# Patient Record
Sex: Male | Born: 1950
Health system: Southern US, Community
[De-identification: ages and names within clinical notes are randomized; demographics above are authoritative.]

## PROBLEM LIST (undated history)

## (undated) DIAGNOSIS — E785 Hyperlipidemia, unspecified: Secondary | ICD-10-CM

## (undated) DIAGNOSIS — M51369 Other intervertebral disc degeneration, lumbar region without mention of lumbar back pain or lower extremity pain: Secondary | ICD-10-CM

## (undated) DIAGNOSIS — K219 Gastro-esophageal reflux disease without esophagitis: Secondary | ICD-10-CM

## (undated) DIAGNOSIS — M5136 Other intervertebral disc degeneration, lumbar region: Secondary | ICD-10-CM

## (undated) DIAGNOSIS — I10 Essential (primary) hypertension: Secondary | ICD-10-CM

## (undated) HISTORY — DX: Gastro-esophageal reflux disease without esophagitis: K21.9

## (undated) HISTORY — DX: Essential (primary) hypertension: I10

## (undated) HISTORY — DX: Other intervertebral disc degeneration, lumbar region without mention of lumbar back pain or lower extremity pain: M51.369

## (undated) HISTORY — DX: Other intervertebral disc degeneration, lumbar region: M51.36

## (undated) HISTORY — PX: KNEE ARTHROSCOPY: SUR90

## (undated) HISTORY — DX: Hyperlipidemia, unspecified: E78.5

## (undated) HISTORY — PX: COLONOSCOPY: SHX174

---

## 2008-11-01 HISTORY — PX: POLYPECTOMY: SHX149

## 2019-12-12 ENCOUNTER — Other Ambulatory Visit: Payer: Self-pay

## 2019-12-13 ENCOUNTER — Encounter: Payer: Self-pay | Admitting: Internal Medicine

## 2019-12-13 ENCOUNTER — Ambulatory Visit: Payer: Medicare Other | Admitting: Internal Medicine

## 2019-12-13 VITALS — BP 130/80 | HR 67 | Temp 97.4°F | Ht 69.0 in | Wt 174.4 lb

## 2019-12-13 DIAGNOSIS — E785 Hyperlipidemia, unspecified: Secondary | ICD-10-CM | POA: Insufficient documentation

## 2019-12-13 DIAGNOSIS — L219 Seborrheic dermatitis, unspecified: Secondary | ICD-10-CM

## 2019-12-13 DIAGNOSIS — M5136 Other intervertebral disc degeneration, lumbar region: Secondary | ICD-10-CM | POA: Insufficient documentation

## 2019-12-13 DIAGNOSIS — I10 Essential (primary) hypertension: Secondary | ICD-10-CM | POA: Insufficient documentation

## 2019-12-13 DIAGNOSIS — H6122 Impacted cerumen, left ear: Secondary | ICD-10-CM

## 2019-12-13 DIAGNOSIS — Z1211 Encounter for screening for malignant neoplasm of colon: Secondary | ICD-10-CM

## 2019-12-13 DIAGNOSIS — M51369 Other intervertebral disc degeneration, lumbar region without mention of lumbar back pain or lower extremity pain: Secondary | ICD-10-CM | POA: Insufficient documentation

## 2019-12-13 MED ORDER — LISINOPRIL-HYDROCHLOROTHIAZIDE 20-12.5 MG PO TABS: 1.0000 | ORAL_TABLET | Freq: Two times a day (BID) | ORAL | 1 refills | Status: DC

## 2019-12-13 MED ORDER — TRIAMCINOLONE ACETONIDE 0.1 % EX CREA: 1.0000 "application " | TOPICAL_CREAM | Freq: Two times a day (BID) | CUTANEOUS | 1 refills | Status: DC

## 2019-12-13 MED ORDER — PRAVASTATIN SODIUM 40 MG PO TABS: 40.0000 mg | ORAL_TABLET | Freq: Every day | ORAL | 1 refills | Status: DC

## 2019-12-13 NOTE — Patient Instructions (Signed)
-  Nice seeing you today!!  -Schedule follow up at your convenience for your physical. Please come in fasting that day.  -Triamcinolone cream twice daily to above your left ear.

## 2019-12-13 NOTE — Progress Notes (Signed)
New Patient Office Visit     This visit occurred during the SARS-CoV-2 public health emergency.  Safety protocols were in place, including screening questions prior to the visit, additional usage of staff PPE, and extensive cleaning of exam room while observing appropriate contact time as indicated for disinfecting solutions.    CC/Reason for Visit: Establish care, discuss chronic and acute conditions Previous PCP: Dr. Sherryle Lis in [place] Visit: March 2020  HPI: Dustin Hodges is a 69 y.o. male who is coming in today for the above mentioned reasons. Past Medical History is significant for: Hypertension and hyperlipidemia that have been well controlled as well as lumbar degenerative disc disease that is not causing much issues at present.  He and his wife have recently moved from [place], he is a retired Forensic psychologist, he does not smoke, drinks alcohol occasionally, has no known drug allergies, his past surgical history is only significant for a medial meniscus repair in his knee, his dad is deceased from congestive heart failure in his mother from a CVA.  He complains of discharge out of his left ear and a "crusty scalp" since moving to [place] most prominent above his left ear.   Past Medical/Surgical History: Past Medical History:  Diagnosis Date  . DDD (degenerative disc disease), lumbar   . Hyperlipidemia   . Hypertension     History reviewed. No pertinent surgical history.  Social History:  reports that he has never smoked. He has never used smokeless tobacco. He reports current alcohol use. He reports that he does not use drugs.  Allergies: No Known Allergies  Family History:  Family History  Problem Relation Age of Onset  . CVA Mother   . Heart failure Father      Current Outpatient Medications:  .  aspirin EC 81 MG tablet, Take 81 mg by mouth daily., Disp: , Rfl:  .  Cholecalciferol (VITAMIN D) 50 MCG (2000 UT) tablet, Take  2,000 Units by mouth daily., Disp: , Rfl:  .  fluticasone (FLONASE) 50 MCG/ACT nasal spray, Place 1 spray into both nostrils daily., Disp: , Rfl:  .  lisinopril-hydrochlorothiazide (ZESTORETIC) 20-12.5 MG tablet, Take 1 tablet by mouth 2 (two) times daily., Disp: 90 tablet, Rfl: 1 .  pravastatin (PRAVACHOL) 40 MG tablet, Take 1 tablet (40 mg total) by mouth at bedtime., Disp: 90 tablet, Rfl: 1 .  triamcinolone cream (KENALOG) 0.1 %, Apply 1 application topically 2 (two) times daily., Disp: 30 g, Rfl: 1  Review of Systems:  Constitutional: Denies fever, chills, diaphoresis, appetite change and fatigue.  HEENT: Denies photophobia, eye pain, redness, hearing loss, ear pain, congestion, sore throat, rhinorrhea, sneezing, mouth sores, trouble swallowing, neck pain, neck stiffness and tinnitus.   Respiratory: Denies SOB, DOE, cough, chest tightness,  and wheezing.   Cardiovascular: Denies chest pain, palpitations and leg swelling.  Gastrointestinal: Denies nausea, vomiting, abdominal pain, diarrhea, constipation, blood in stool and abdominal distention.  Genitourinary: Denies dysuria, urgency, frequency, hematuria, flank pain and difficulty urinating.  Endocrine: Denies: hot or cold intolerance, sweats, changes in hair or nails, polyuria, polydipsia. Musculoskeletal: Denies myalgias, back pain, joint swelling, arthralgias and gait problem.  Skin: Denies pallor, rash and wound.  Neurological: Denies dizziness, seizures, syncope, weakness, light-headedness, numbness and headaches.  Hematological: Denies adenopathy. Easy bruising, personal or family bleeding history  Psychiatric/Behavioral: Denies suicidal ideation, mood changes, confusion, nervousness, sleep disturbance and agitation    Physical Exam: Vitals:   12/13/19 0825  BP: 130/80  Pulse:  67  Temp: (!) 97.4 F (36.3 C)  TempSrc: Temporal  SpO2: 97%  Weight: 174 lb 6.4 oz (79.1 kg)  Height: 5\' 9"  (1.753 m)   Body mass index is 25.75  kg/m.  Constitutional: NAD, calm, comfortable Eyes: PERRL, lids and conjunctivae normal, wears corrective lenses ENMT: Mucous membranes are moist. Tympanic membrane is pearly white, no erythema or bulging on the right, left is obstructed by cerumen Neck: normal, supple, no masses, no thyromegaly Respiratory: clear to auscultation bilaterally, no wheezing, no crackles. Normal respiratory effort. No accessory muscle use.  Cardiovascular: Regular rate and rhythm, no murmurs / rubs / gallops. No extremity edema.  Skin: Redness and white, scaly skin over the left ear Neurologic: Grossly intact and nonfocal Psychiatric: Normal judgment and insight. Alert and oriented x 3. Normal mood.    Impression and Plan:  Essential hypertension  - Plan: lisinopril-hydrochlorothiazide (ZESTORETIC) 20-12.5 MG tablet -Well-controlled.  Hyperlipidemia, unspecified hyperlipidemia type  - Plan: pravastatin (PRAVACHOL) 40 MG tablet -Check lipids when he returns for physical  DDD (degenerative disc disease), lumbar -Noted, observation for now  Left ear impacted cerumen -Cerumen Desimpaction  After consent was obtained, warm water was applied and gentle ear lavage performed on Left ear. There were no complications and following the desimpaction the tympanic membranes were visible. Tympanic membranes are intact following the procedure. Auditory canals are normal. The patient reported relief of symptoms after removal of cerumen.   Seborrheic dermatitis  - Plan: triamcinolone cream (KENALOG) 0.1 %  He will return in 8 to 12 weeks for his physical.    Patient Instructions  -Nice seeing you today!!  -Schedule follow up at your convenience for your physical. Please come in fasting that day.  -Triamcinolone cream twice daily to above your left ear.     Lelon Frohlich, MD [facility]

## 2020-01-15 ENCOUNTER — Encounter: Payer: Self-pay | Admitting: Gastroenterology

## 2020-01-30 ENCOUNTER — Other Ambulatory Visit: Payer: Self-pay

## 2020-01-30 ENCOUNTER — Ambulatory Visit (AMBULATORY_SURGERY_CENTER): Payer: Self-pay | Admitting: *Deleted

## 2020-01-30 VITALS — Temp 96.9°F | Ht 69.0 in | Wt 174.0 lb

## 2020-01-30 DIAGNOSIS — Z86010 Personal history of colonic polyps: Secondary | ICD-10-CM

## 2020-01-30 MED ORDER — PEG 3350-KCL-NA BICARB-NACL 420 G PO SOLR: 4000.0000 mL | Freq: Once | ORAL | 0 refills | Status: AC

## 2020-01-30 NOTE — Progress Notes (Signed)
No egg or soy allergy known to patient  No issues with past sedation with any surgeries  or procedures, no intubation problems  No diet pills per patient No home 02 use per patient  No blood thinners per patient  Pt denies issues with constipation  No A fib or A flutter  EMMI video sent to pt's e mail   Pt states 2010 colon in [place] had a benign polyp- he is unsure of path of this polyp- 2015-2016 repeat colon normal-  Pt signed records release and will attempt to find out Md/Practice for [place] to get old colon reports and path - he will call with name if he can find out   3-12 covid vaccine completed  Due to the COVID-19 pandemic we are asking patients to follow these guidelines. Please only bring one care partner. Please be aware that your care partner may wait in the car in the parking lot or if they feel like they will be too hot to wait in the car, they may wait in the lobby on the 4th floor. All care partners are required to wear a mask the entire time (we do not have any that we can provide them), they need to practice social distancing, and we will do a Covid check for all patient's and care partners when you arrive. Also we will check their temperature and your temperature. If the care partner waits in their car they need to stay in the parking lot the entire time and we will call them on their cell phone when the patient is ready for discharge so they can bring the car to the front of the building. Also all patient's will need to wear a mask into building.

## 2020-02-04 ENCOUNTER — Telehealth: Payer: Self-pay | Admitting: Gastroenterology

## 2020-02-04 NOTE — Telephone Encounter (Signed)
Pt called to give you the information that you asked him about. Pt said that he had Colon in 2010 and 2015 or 2016 with Dr. Johnanna Schneiders. His office number is [phone]. Pt already signed release form.

## 2020-02-04 NOTE — Telephone Encounter (Signed)
Records release completed and taken to [facility] todd CMA Dr Loletha Carrow - for Dr Johnanna Schneiders   [facility] end[facility] center - [phone] [place] [id number]  Last colon reports 2010 and 2015 or 2016 - per pt he had polyps-  Fax [phone] marie PV

## 2020-02-08 ENCOUNTER — Encounter: Payer: Self-pay | Admitting: Gastroenterology

## 2020-02-13 ENCOUNTER — Other Ambulatory Visit: Payer: Self-pay

## 2020-02-13 ENCOUNTER — Encounter: Payer: Self-pay | Admitting: Gastroenterology

## 2020-02-13 ENCOUNTER — Ambulatory Visit (AMBULATORY_SURGERY_CENTER): Payer: Medicare Other | Admitting: Gastroenterology

## 2020-02-13 VITALS — BP 92/65 | HR 61 | Temp 97.1°F | Resp 15 | Ht 69.0 in | Wt 174.0 lb

## 2020-02-13 DIAGNOSIS — Z86010 Personal history of colonic polyps: Secondary | ICD-10-CM

## 2020-02-13 DIAGNOSIS — D123 Benign neoplasm of transverse colon: Secondary | ICD-10-CM | POA: Diagnosis not present

## 2020-02-13 MED ORDER — SODIUM CHLORIDE 0.9 % IV SOLN: 500.0000 mL | Freq: Once | INTRAVENOUS | Status: DC

## 2020-02-13 NOTE — Progress Notes (Signed)
Temp by JB Vitals by DT 

## 2020-02-13 NOTE — Progress Notes (Signed)
pt tolerated well. VSS. awake and to recovery. Report given to RN.  

## 2020-02-13 NOTE — Patient Instructions (Signed)
Thank you for allowing [place] to care for you today!  Await results of singular polyp removed, approximately 2 weeks.  Will make recommendation at that time for your next colonoscopy.  Resume previous diet and medications today.  Return to your normal activities tomorrow.  YOU HAD AN ENDOSCOPIC PROCEDURE TODAY AT [facility]:   Refer to the procedure report that was given to you for any specific questions about what was found during the examination.  If the procedure report does not answer your questions, please call your gastroenterologist to clarify.  If you requested that your care partner not be given the details of your procedure findings, then the procedure report has been included in a sealed envelope for you to review at your convenience later.  YOU SHOULD EXPECT: Some feelings of bloating in the abdomen. Passage of more gas than usual.  Walking can help get rid of the air that was put into your GI tract during the procedure and reduce the bloating. If you had a lower endoscopy (such as a colonoscopy or flexible sigmoidoscopy) you may notice spotting of blood in your stool or on the toilet paper. If you underwent a bowel prep for your procedure, you may not have a normal bowel movement for a few days.  Please Note:  You might notice some irritation and congestion in your nose or some drainage.  This is from the oxygen used during your procedure.  There is no need for concern and it should clear up in a day or so.  SYMPTOMS TO REPORT IMMEDIATELY:   Following lower endoscopy (colonoscopy or flexible sigmoidoscopy):  Excessive amounts of blood in the stool  Significant tenderness or worsening of abdominal pains  Swelling of the abdomen that is new, acute  Fever of 100F or higher   Following upper endoscopy (EGD)  Vomiting of blood or coffee ground material  New chest pain or pain under the shoulder blades  Painful or persistently difficult swallowing  New shortness of  breath  Fever of 100F or higher  Black, tarry-looking stools  For urgent or emergent issues, a gastroenterologist can be reached at any hour by calling [phone]. Do not use MyChart messaging for urgent concerns.    DIET:  We do recommend a small meal at first, but then you may proceed to your regular diet.  Drink plenty of fluids but you should avoid alcoholic beverages for 24 hours.  ACTIVITY:  You should plan to take it easy for the rest of today and you should NOT DRIVE or use heavy machinery until tomorrow (because of the sedation medicines used during the test).    FOLLOW UP: Our staff will call the number listed on your records 48-72 hours following your procedure to check on you and address any questions or concerns that you may have regarding the information given to you following your procedure. If we do not reach you, we will leave a message.  We will attempt to reach you two times.  During this call, we will ask if you have developed any symptoms of COVID 19. If you develop any symptoms (ie: fever, flu-like symptoms, shortness of breath, cough etc.) before then, please call [phone].  If you test positive for Covid 19 in the 2 weeks post procedure, please call and report this information to [place].    If any biopsies were taken you will be contacted by phone or by letter within the next 1-3 weeks.  Please call us at [phone]  [phone] if you have not heard about the biopsies in 3 weeks.    SIGNATURES/CONFIDENTIALITY: You and/or your care partner have signed paperwork which will be entered into your electronic medical record.  These signatures attest to the fact that that the information above on your After Visit Summary has been reviewed and is understood.  Full responsibility of the confidentiality of this discharge information lies with you and/or your care-partner.YOU HAD AN ENDOSCOPIC PROCEDURE TODAY AT [facility]:   Refer to the procedure report that was given  to you for any specific questions about what was found during the examination.  If the procedure report does not answer your questions, please call your gastroenterologist to clarify.  If you requested that your care partner not be given the details of your procedure findings, then the procedure report has been included in a sealed envelope for you to review at your convenience later.  YOU SHOULD EXPECT: Some feelings of bloating in the abdomen. Passage of more gas than usual.  Walking can help get rid of the air that was put into your GI tract during the procedure and reduce the bloating. If you had a lower endoscopy (such as a colonoscopy or flexible sigmoidoscopy) you may notice spotting of blood in your stool or on the toilet paper. If you underwent a bowel prep for your procedure, you may not have a normal bowel movement for a few days.  Please Note:  You might notice some irritation and congestion in your nose or some drainage.  This is from the oxygen used during your procedure.  There is no need for concern and it should clear up in a day or so.  SYMPTOMS TO REPORT IMMEDIATELY:   Following lower endoscopy (colonoscopy or flexible sigmoidoscopy):  Excessive amounts of blood in the stool  Significant tenderness or worsening of abdominal pains  Swelling of the abdomen that is new, acute  Fever of 100F or higher    For urgent or emergent issues, a gastroenterologist can be reached at any hour by calling [phone]. Do not use MyChart messaging for urgent concerns.    DIET:  We do recommend a small meal at first, but then you may proceed to your regular diet.  Drink plenty of fluids but you should avoid alcoholic beverages for 24 hours.  ACTIVITY:  You should plan to take it easy for the rest of today and you should NOT DRIVE or use heavy machinery until tomorrow (because of the sedation medicines used during the test).    FOLLOW UP: Our staff will call the number listed on your  records 48-72 hours following your procedure to check on you and address any questions or concerns that you may have regarding the information given to you following your procedure. If we do not reach you, we will leave a message.  We will attempt to reach you two times.  During this call, we will ask if you have developed any symptoms of COVID 19. If you develop any symptoms (ie: fever, flu-like symptoms, shortness of breath, cough etc.) before then, please call [phone].  If you test positive for Covid 19 in the 2 weeks post procedure, please call and report this information to [place].    If any biopsies were taken you will be contacted by phone or by letter within the next 1-3 weeks.  Please call us at [phone] if you have not heard about the biopsies in 3 weeks.    SIGNATURES/CONFIDENTIALITY: You  and/or your care partner have signed paperwork which will be entered into your electronic medical record.  These signatures attest to the fact that that the information above on your After Visit Summary has been reviewed and is understood.  Full responsibility of the confidentiality of this discharge information lies with you and/or your care-partner.

## 2020-02-13 NOTE — Progress Notes (Signed)
Called to room to assist during endoscopic procedure.  Patient ID and intended procedure confirmed with present staff. Received instructions for my participation in the procedure from the performing physician.  

## 2020-02-13 NOTE — Op Note (Signed)
[facility] Patient Name: Dustin Hodges Procedure Date: 02/13/2020 7:35 AM MRN: [id number] Endoscopist: [place]. Loletha Carrow , MD Age: 69 Referring MD:  Date of Birth: February 08, 1951 Gender: Male Account #: [id number] Procedure:                Colonoscopy Indications:              Surveillance: Personal history of colonic polyps                            (unknown histology) on last colonoscopy 5 years ago                            (records from [place] show no polyps 2005, 2010, 2016.                            Endoscopist recommended 5 year interval) Medicines:                Monitored Anesthesia Care Procedure:                Pre-Anesthesia Assessment:                           - Prior to the procedure, a History and Physical                            was performed, and patient medications and                            allergies were reviewed. The patient's tolerance of                            previous anesthesia was also reviewed. The risks                            and benefits of the procedure and the sedation                            options and risks were discussed with the patient.                            All questions were answered, and informed consent                            was obtained. Prior Anticoagulants: The patient has                            taken no previous anticoagulant or antiplatelet                            agents. ASA Grade Assessment: II - A patient with                            mild systemic disease. After reviewing the risks  and benefits, the patient was deemed in                            satisfactory condition to undergo the procedure.                           After obtaining informed consent, the colonoscope                            was passed under direct vision. Throughout the                            procedure, the patient's blood pressure, pulse, and                            oxygen saturations  were monitored continuously. The                            Colonoscope was introduced through the anus and                            advanced to the the cecum, identified by                            appendiceal orifice and ileocecal valve. The                            colonoscopy was performed without difficulty. The                            patient tolerated the procedure well. The quality                            of the bowel preparation was excellent. The                            ileocecal valve, appendiceal orifice, and rectum                            were photographed. The bowel preparation used was                            GoLYTELY. Scope In: 8:22:13 AM Scope Out: 8:37:45 AM Scope Withdrawal Time: 0 hours 12 minutes 27 seconds  Total Procedure Duration: 0 hours 15 minutes 32 seconds  Findings:                 The perianal and digital rectal examinations were                            normal.                           Multiple small-mouthed diverticula were found in  the left colon.                           A diminutive polyp was found in the transverse                            colon. The polyp was sessile. The polyp was removed                            with a cold snare. Resection and retrieval were                            complete.                           The exam was otherwise without abnormality on                            direct and retroflexion views. Complications:            No immediate complications. Estimated Blood Loss:     Estimated blood loss was minimal. Impression:               - Diverticulosis in the left colon.                           - One diminutive polyp in the transverse colon,                            removed with a cold snare. Resected and retrieved.                           - The examination was otherwise normal on direct                            and retroflexion views. Recommendation:            - Patient has a contact number available for                            emergencies. The signs and symptoms of potential                            delayed complications were discussed with the                            patient. Return to normal activities tomorrow.                            Written discharge instructions were provided to the                            patient.                           - Resume previous diet.                           -  Continue present medications.                           - Await pathology results.                           - Repeat colonoscopy is recommended for                            surveillance. The colonoscopy date will be                            determined after pathology results from today's                            exam become available for review. Cyndy Braver L. Loletha Carrow, MD 02/13/2020 8:44:52 AM This report has been signed electronically.

## 2020-02-15 ENCOUNTER — Telehealth: Payer: Self-pay | Admitting: *Deleted

## 2020-02-15 ENCOUNTER — Encounter: Payer: Self-pay | Admitting: Gastroenterology

## 2020-02-15 NOTE — Telephone Encounter (Signed)
  Follow up Call-  Call back number 02/13/2020  Post procedure Call Back phone  # [phone]  Permission to leave phone message Yes     Patient questions:  Do you have a fever, pain , or abdominal swelling? No. Pain Score  0 *  Have you tolerated food without any problems? Yes.    Have you been able to return to your normal activities? Yes.    Do you have any questions about your discharge instructions: Diet   No. Medications  No. Follow up visit  No.  Do you have questions or concerns about your Care? No.  Actions: * If pain score is 4 or above: No action needed, pain <4.   1. Have you developed a fever since your procedure? no  2.   Have you had an respiratory symptoms (SOB or cough) since your procedure? no  3.   Have you tested positive for COVID 19 since your procedure no  4.   Have you had any family members/close contacts diagnosed with the COVID 19 since your procedure?  no   If yes to any of these questions please route to Joylene John, RN and Erenest Rasher, RN

## 2020-03-03 ENCOUNTER — Other Ambulatory Visit: Payer: Self-pay

## 2020-03-04 ENCOUNTER — Encounter: Payer: Self-pay | Admitting: Internal Medicine

## 2020-03-04 ENCOUNTER — Ambulatory Visit (INDEPENDENT_AMBULATORY_CARE_PROVIDER_SITE_OTHER): Payer: Medicare Other | Admitting: Internal Medicine

## 2020-03-04 VITALS — BP 140/80 | HR 77 | Temp 97.1°F | Ht 68.5 in | Wt 172.9 lb

## 2020-03-04 DIAGNOSIS — I10 Essential (primary) hypertension: Secondary | ICD-10-CM | POA: Diagnosis not present

## 2020-03-04 DIAGNOSIS — Z23 Encounter for immunization: Secondary | ICD-10-CM

## 2020-03-04 DIAGNOSIS — E785 Hyperlipidemia, unspecified: Secondary | ICD-10-CM

## 2020-03-04 DIAGNOSIS — Z0000 Encounter for general adult medical examination without abnormal findings: Secondary | ICD-10-CM

## 2020-03-04 LAB — CBC WITH DIFFERENTIAL/PLATELET
Basophils Absolute: 0 10*3/uL (ref 0.0–0.1)
Basophils Relative: 0.6 % (ref 0.0–3.0)
Eosinophils Absolute: 0.1 10*3/uL (ref 0.0–0.7)
Eosinophils Relative: 1.9 % (ref 0.0–5.0)
HCT: 48.3 % (ref 39.0–52.0)
Hemoglobin: 16.7 g/dL (ref 13.0–17.0)
Lymphocytes Relative: 19.4 % (ref 12.0–46.0)
Lymphs Abs: 1.2 10*3/uL (ref 0.7–4.0)
MCHC: 34.7 g/dL (ref 30.0–36.0)
MCV: 88 fl (ref 78.0–100.0)
Monocytes Absolute: 0.7 10*3/uL (ref 0.1–1.0)
Monocytes Relative: 11.1 % (ref 3.0–12.0)
Neutro Abs: 4.3 10*3/uL (ref 1.4–7.7)
Neutrophils Relative %: 67 % (ref 43.0–77.0)
Platelets: 227 10*3/uL (ref 150.0–400.0)
RBC: 5.49 Mil/uL (ref 4.22–5.81)
RDW: 13.3 % (ref 11.5–15.5)
WBC: 6.4 10*3/uL (ref 4.0–10.5)

## 2020-03-04 LAB — COMPREHENSIVE METABOLIC PANEL
ALT: 20 U/L (ref 0–53)
AST: 20 U/L (ref 0–37)
Albumin: 4.4 g/dL (ref 3.5–5.2)
Alkaline Phosphatase: 58 U/L (ref 39–117)
BUN: 20 mg/dL (ref 6–23)
CO2: 30 mEq/L (ref 19–32)
Calcium: 9.4 mg/dL (ref 8.4–10.5)
Chloride: 97 mEq/L (ref 96–112)
Creatinine, Ser: 1 mg/dL (ref 0.40–1.50)
GFR: 74.65 mL/min (ref >60.00)
Glucose, Bld: 97 mg/dL (ref 70–99)
Potassium: 4.3 mEq/L (ref 3.5–5.1)
Sodium: 132 mEq/L — ABNORMAL LOW (ref 135–145)
Total Bilirubin: 1 mg/dL (ref 0.2–1.2)
Total Protein: 7.3 g/dL (ref 6.0–8.3)

## 2020-03-04 LAB — PSA: PSA: 1.24 ng/mL (ref 0.10–4.00)

## 2020-03-04 LAB — LIPID PANEL
Cholesterol: 176 mg/dL (ref 0–200)
HDL: 46.9 mg/dL (ref >39.00)
LDL Cholesterol: 104 mg/dL — ABNORMAL HIGH (ref 0–99)
NonHDL: 128.76
Total CHOL/HDL Ratio: 4
Triglycerides: 124 mg/dL (ref 0.0–149.0)
VLDL: 24.8 mg/dL (ref 0.0–40.0)

## 2020-03-04 LAB — VITAMIN B12: Vitamin B-12: 389 pg/mL (ref 211–911)

## 2020-03-04 LAB — HEMOGLOBIN A1C: Hgb A1c MFr Bld: 5 % (ref 4.6–6.5)

## 2020-03-04 LAB — VITAMIN D 25 HYDROXY (VIT D DEFICIENCY, FRACTURES): VITD: 86.88 ng/mL (ref 30.00–100.00)

## 2020-03-04 LAB — TSH: TSH: 1.7 u[IU]/mL (ref 0.35–4.50)

## 2020-03-04 NOTE — Patient Instructions (Addendum)
-Nice seeing you today!!  -Lab work today; will notify you once results are available.  -Pneumonia vaccine today.  -See you back in 1 year or sooner as needed.   Preventive Care 4 Years and Older, Male Preventive care refers to lifestyle choices and visits with your health care provider that can promote health and wellness. This includes:  A yearly physical exam. This is also called an annual well check.  Regular dental and eye exams.  Immunizations.  Screening for certain conditions.  Healthy lifestyle choices, such as diet and exercise. What can I expect for my preventive care visit? Physical exam Your health care provider will check:  Height and weight. These may be used to calculate body mass index (BMI), which is a measurement that tells if you are at a healthy weight.  Heart rate and blood pressure.  Your skin for abnormal spots. Counseling Your health care provider may ask you questions about:  Alcohol, tobacco, and drug use.  Emotional well-being.  Home and relationship well-being.  Sexual activity.  Eating habits.  History of falls.  Memory and ability to understand (cognition).  Work and work Statistician. What immunizations do I need?  Influenza (flu) vaccine  This is recommended every year. Tetanus, diphtheria, and pertussis (Tdap) vaccine  You may need a Td booster every 10 years. Varicella (chickenpox) vaccine  You may need this vaccine if you have not already been vaccinated. Zoster (shingles) vaccine  You may need this after age 37. Pneumococcal conjugate (PCV13) vaccine  One dose is recommended after age 74. Pneumococcal polysaccharide (PPSV23) vaccine  One dose is recommended after age 71. Measles, mumps, and rubella (MMR) vaccine  You may need at least one dose of MMR if you were born in 1957 or later. You may also need a second dose. Meningococcal conjugate (MenACWY) vaccine  You may need this if you have certain conditions.  Hepatitis A vaccine  You may need this if you have certain conditions or if you travel or work in places where you may be exposed to hepatitis A. Hepatitis B vaccine  You may need this if you have certain conditions or if you travel or work in places where you may be exposed to hepatitis B. Haemophilus influenzae type b (Hib) vaccine  You may need this if you have certain conditions. You may receive vaccines as individual doses or as more than one vaccine together in one shot (combination vaccines). Talk with your health care provider about the risks and benefits of combination vaccines. What tests do I need? Blood tests  Lipid and cholesterol levels. These may be checked every 5 years, or more frequently depending on your overall health.  Hepatitis C test.  Hepatitis B test. Screening  Lung cancer screening. You may have this screening every year starting at age 32 if you have a 30-pack-year history of smoking and currently smoke or have quit within the past 15 years.  Colorectal cancer screening. All adults should have this screening starting at age 73 and continuing until age 30. Your health care provider may recommend screening at age 26 if you are at increased risk. You will have tests every 1-10 years, depending on your results and the type of screening test.  Prostate cancer screening. Recommendations will vary depending on your family history and other risks.  Diabetes screening. This is done by checking your blood sugar (glucose) after you have not eaten for a while (fasting). You may have this done every 1-3 years.  Abdominal  aortic aneurysm (AAA) screening. You may need this if you are a current or former smoker.  Sexually transmitted disease (STD) testing. Follow these instructions at home: Eating and drinking  Eat a diet that includes fresh fruits and vegetables, whole grains, lean protein, and low-fat dairy products. Limit your intake of foods with high amounts of  sugar, saturated fats, and salt.  Take vitamin and mineral supplements as recommended by your health care provider.  Do not drink alcohol if your health care provider tells you not to drink.  If you drink alcohol: ? Limit how much you have to 0-2 drinks a day. ? Be aware of how much alcohol is in your drink. In the [place] one drink equals one 12 oz bottle of beer (355 mL), one 5 oz glass of wine (148 mL), or one 1 oz glass of hard liquor (44 mL). Lifestyle  Take daily care of your teeth and gums.  Stay active. Exercise for at least 30 minutes on 5 or more days each week.  Do not use any products that contain nicotine or tobacco, such as cigarettes, e-cigarettes, and chewing tobacco. If you need help quitting, ask your health care provider.  If you are sexually active, practice safe sex. Use a condom or other form of protection to prevent STIs (sexually transmitted infections).  Talk with your health care provider about taking a low-dose aspirin or statin. What's next?  Visit your health care provider once a year for a well check visit.  Ask your health care provider how often you should have your eyes and teeth checked.  Stay up to date on all vaccines. This information is not intended to replace advice given to you by your health care provider. Make sure you discuss any questions you have with your health care provider. Document Revised: 10/12/2018 Document Reviewed: 10/12/2018 Elsevier Patient Education  2020 [employer].

## 2020-03-04 NOTE — Addendum Note (Signed)
Addended by: Denna Haggard K on: 03/04/2020 10:20 AM   Modules accepted: Orders

## 2020-03-04 NOTE — Progress Notes (Signed)
Established Patient Office Visit     This visit occurred during the SARS-CoV-2 public health emergency.  Safety protocols were in place, including screening questions prior to the visit, additional usage of staff PPE, and extensive cleaning of exam room while observing appropriate contact time as indicated for disinfecting solutions.    CC/Reason for Visit: Annual preventive exam and subsequent Medicare wellness visit  HPI: Dustin Hodges is a 69 y.o. male who is coming in today for the above mentioned reasons. Past Medical History is significant for: He has a history of well-controlled hypertension and hyperlipidemia.  He has no acute complaints today.  He has routine dental care but no eye care.  He has received both of his Covid vaccines.  He had a colonoscopy earlier this month.   Past Medical/Surgical History: Past Medical History:  Diagnosis Date  . DDD (degenerative disc disease), lumbar   . GERD (gastroesophageal reflux disease)    possibly and untreated   . Hyperlipidemia   . Hypertension     Past Surgical History:  Procedure Laterality Date  . COLONOSCOPY  last 2015-2016   last colon in [place] 2015- normal   . KNEE ARTHROSCOPY     meniscus tear   . POLYPECTOMY  2010   benign - pt unsure of lb path on polyp     Social History:  reports that he has never smoked. He has never used smokeless tobacco. He reports current alcohol use. He reports that he does not use drugs.  Allergies: No Known Allergies  Family History:  Family History  Problem Relation Age of Onset  . CVA Mother   . Heart failure Father   . Colon cancer Neg Hx   . Colon polyps Neg Hx   . Esophageal cancer Neg Hx   . Rectal cancer Neg Hx   . Stomach cancer Neg Hx      Current Outpatient Medications:  .  aspirin EC 81 MG tablet, Take 81 mg by mouth daily., Disp: , Rfl:  .  Cholecalciferol (VITAMIN D) 50 MCG (2000 UT) tablet, Take 2,000 Units by mouth daily., Disp: , Rfl:  .   fluticasone (FLONASE) 50 MCG/ACT nasal spray, Place 1 spray into both nostrils daily., Disp: , Rfl:  .  lisinopril-hydrochlorothiazide (ZESTORETIC) 20-12.5 MG tablet, Take 1 tablet by mouth 2 (two) times daily., Disp: 90 tablet, Rfl: 1 .  pravastatin (PRAVACHOL) 40 MG tablet, Take 1 tablet (40 mg total) by mouth at bedtime., Disp: 90 tablet, Rfl: 1 .  triamcinolone cream (KENALOG) 0.1 %, Apply 1 application topically 2 (two) times daily., Disp: 30 g, Rfl: 1  Review of Systems:  Constitutional: Denies fever, chills, diaphoresis, appetite change and fatigue.  HEENT: Denies photophobia, eye pain, redness, hearing loss, ear pain, congestion, sore throat, rhinorrhea, sneezing, mouth sores, trouble swallowing, neck pain, neck stiffness and tinnitus.   Respiratory: Denies SOB, DOE, cough, chest tightness,  and wheezing.   Cardiovascular: Denies chest pain, palpitations and leg swelling.  Gastrointestinal: Denies nausea, vomiting, abdominal pain, diarrhea, constipation, blood in stool and abdominal distention.  Genitourinary: Denies dysuria, urgency, frequency, hematuria, flank pain and difficulty urinating.  Endocrine: Denies: hot or cold intolerance, sweats, changes in hair or nails, polyuria, polydipsia. Musculoskeletal: Denies myalgias, back pain, joint swelling, arthralgias and gait problem.  Skin: Denies pallor, rash and wound.  Neurological: Denies dizziness, seizures, syncope, weakness, light-headedness, numbness and headaches.  Hematological: Denies adenopathy. Easy bruising, personal or family bleeding history  Psychiatric/Behavioral: Denies suicidal  ideation, mood changes, confusion, nervousness, sleep disturbance and agitation    Physical Exam: Vitals:   03/04/20 0938  BP: 140/80  Pulse: 77  Temp: (!) 97.1 F (36.2 C)  TempSrc: Temporal  SpO2: 98%  Weight: 172 lb 14.4 oz (78.4 kg)  Height: 5' 8.5" (1.74 m)    Body mass index is 25.91 kg/m.   Constitutional: NAD, calm,  comfortable Eyes: PERRL, lids and conjunctivae normal, wears corrective lenses ENMT: Mucous membranes are moist.Tympanic membrane is pearly white, no erythema or bulging. Neck: normal, supple, no masses, no thyromegaly Respiratory: clear to auscultation bilaterally, no wheezing, no crackles. Normal respiratory effort. No accessory muscle use.  Cardiovascular: Regular rate and rhythm, no murmurs / rubs / gallops. No extremity edema. 2+ pedal pulses.  Abdomen: no tenderness, no masses palpated. No hepatosplenomegaly. Bowel sounds positive.  Musculoskeletal: no clubbing / cyanosis. No joint deformity upper and lower extremities. Good ROM, no contractures. Normal muscle tone.  Skin: no rashes, lesions, ulcers. No induration Neurologic: CN 2-12 grossly intact. Sensation intact, DTR normal. Strength 5/5 in all 4.  Psychiatric: Normal judgment and insight. Alert and oriented x 3. Normal mood.   Subsequent Medicare wellness visit   1. Risk factors, based on past  M,S,F -cardiovascular disease risk factors include age, gender, history of hypertension, history of hyperlipidemia   2.  Physical activities: He walks between 12 and 50 miles every week   3.  Depression/mood:  Stable, not depressed   4.  Hearing:  Minimal perceived issues   5.  ADL's: Independent in all ADLs   6.  Fall risk:  Low fall risk   7.  Home safety: No problems identified   8.  Height weight, and visual acuity: Height and weight as above, visual acuity is 20/20 right, 20/25 left and 20/20 with eyes together   9.  Counseling:  Advised to procure at least by annual eye care   10. Lab orders based on risk factors: Laboratory update will be reviewed   11. Referral :  None today   12. Care plan:  Follow-up with me in 1 year or sooner as needed   13. Cognitive assessment:  No cognitive impairment   14. Screening: Patient provided with a written and personalized 5-10 year screening schedule in the AVS.   yes   15.  Provider List Update:   PCP only  16. Advance Directives: Full code     Office Visit from 12/13/2019 in [place] at [facility]  PHQ-9 Total Score  0      Fall Risk  12/13/2019  Falls in the past year? 0  Number falls in past yr: 0  Injury with Fall? 0     Impression and Plan:  Encounter for preventive health examination  -He has routine dental care, have advised eye care. -Prevnar today, otherwise all immunizations are up-to-date.  He also needs shingles but he will get this at his pharmacy.  Essential hypertension  -Blood pressure is elevated in office today.  He states he has been diagnosed with whitecoat hypertension in the past. -Home blood pressure measurements are around 120/70 to 130/80. -Have advised to do ambulatory measurements and bring his log in at next visit, no medication changes today.  Hyperlipidemia, unspecified hyperlipidemia type  - Plan: Lipid panel -Continue pravastatin    Patient Instructions  -Nice seeing you today!!  -Lab work today; will notify you once results are available.  -Pneumonia vaccine today.  -See you back in 1 year or  sooner as needed.   Preventive Care 29 Years and Older, Male Preventive care refers to lifestyle choices and visits with your health care provider that can promote health and wellness. This includes:  A yearly physical exam. This is also called an annual well check.  Regular dental and eye exams.  Immunizations.  Screening for certain conditions.  Healthy lifestyle choices, such as diet and exercise. What can I expect for my preventive care visit? Physical exam Your health care provider will check:  Height and weight. These may be used to calculate body mass index (BMI), which is a measurement that tells if you are at a healthy weight.  Heart rate and blood pressure.  Your skin for abnormal spots. Counseling Your health care provider may ask you questions about:  Alcohol, tobacco, and drug  use.  Emotional well-being.  Home and relationship well-being.  Sexual activity.  Eating habits.  History of falls.  Memory and ability to understand (cognition).  Work and work Statistician. What immunizations do I need?  Influenza (flu) vaccine  This is recommended every year. Tetanus, diphtheria, and pertussis (Tdap) vaccine  You may need a Td booster every 10 years. Varicella (chickenpox) vaccine  You may need this vaccine if you have not already been vaccinated. Zoster (shingles) vaccine  You may need this after age 27. Pneumococcal conjugate (PCV13) vaccine  One dose is recommended after age 28. Pneumococcal polysaccharide (PPSV23) vaccine  One dose is recommended after age 75. Measles, mumps, and rubella (MMR) vaccine  You may need at least one dose of MMR if you were born in 1957 or later. You may also need a second dose. Meningococcal conjugate (MenACWY) vaccine  You may need this if you have certain conditions. Hepatitis A vaccine  You may need this if you have certain conditions or if you travel or work in places where you may be exposed to hepatitis A. Hepatitis B vaccine  You may need this if you have certain conditions or if you travel or work in places where you may be exposed to hepatitis B. Haemophilus influenzae type b (Hib) vaccine  You may need this if you have certain conditions. You may receive vaccines as individual doses or as more than one vaccine together in one shot (combination vaccines). Talk with your health care provider about the risks and benefits of combination vaccines. What tests do I need? Blood tests  Lipid and cholesterol levels. These may be checked every 5 years, or more frequently depending on your overall health.  Hepatitis C test.  Hepatitis B test. Screening  Lung cancer screening. You may have this screening every year starting at age 33 if you have a 30-pack-year history of smoking and currently smoke or have  quit within the past 15 years.  Colorectal cancer screening. All adults should have this screening starting at age 82 and continuing until age 90. Your health care provider may recommend screening at age 79 if you are at increased risk. You will have tests every 1-10 years, depending on your results and the type of screening test.  Prostate cancer screening. Recommendations will vary depending on your family history and other risks.  Diabetes screening. This is done by checking your blood sugar (glucose) after you have not eaten for a while (fasting). You may have this done every 1-3 years.  Abdominal aortic aneurysm (AAA) screening. You may need this if you are a current or former smoker.  Sexually transmitted disease (STD) testing. Follow these instructions at  home: Eating and drinking  Eat a diet that includes fresh fruits and vegetables, whole grains, lean protein, and low-fat dairy products. Limit your intake of foods with high amounts of sugar, saturated fats, and salt.  Take vitamin and mineral supplements as recommended by your health care provider.  Do not drink alcohol if your health care provider tells you not to drink.  If you drink alcohol: ? Limit how much you have to 0-2 drinks a day. ? Be aware of how much alcohol is in your drink. In the [place] one drink equals one 12 oz bottle of beer (355 mL), one 5 oz glass of wine (148 mL), or one 1 oz glass of hard liquor (44 mL). Lifestyle  Take daily care of your teeth and gums.  Stay active. Exercise for at least 30 minutes on 5 or more days each week.  Do not use any products that contain nicotine or tobacco, such as cigarettes, e-cigarettes, and chewing tobacco. If you need help quitting, ask your health care provider.  If you are sexually active, practice safe sex. Use a condom or other form of protection to prevent STIs (sexually transmitted infections).  Talk with your health care provider about taking a low-dose aspirin  or statin. What's next?  Visit your health care provider once a year for a well check visit.  Ask your health care provider how often you should have your eyes and teeth checked.  Stay up to date on all vaccines. This information is not intended to replace advice given to you by your health care provider. Make sure you discuss any questions you have with your health care provider. Document Revised: 10/12/2018 Document Reviewed: 10/12/2018 Elsevier Patient Education  2020 [place], [place] [facility]

## 2020-03-04 NOTE — Addendum Note (Signed)
Addended by: Westley Hummer B on: 03/04/2020 04:28 PM   Modules accepted: Orders

## 2020-03-24 ENCOUNTER — Telehealth: Payer: Self-pay | Admitting: Internal Medicine

## 2020-03-24 DIAGNOSIS — L219 Seborrheic dermatitis, unspecified: Secondary | ICD-10-CM

## 2020-03-24 NOTE — Telephone Encounter (Signed)
Pt call and want a call back about a denial letter and want a referral the dermatologist his name is dr. Syble Creek.

## 2020-03-25 NOTE — Telephone Encounter (Signed)
Left message on machine for patient: 1. Need more information about denial letter 2. Why the referral for derm?

## 2020-03-25 NOTE — Telephone Encounter (Signed)
Denial letter: Pt stated is it a denial of payment regarding chemistry and pathology lab work by [employer] for service May 4th 2021.  Pt is not certain if the office is aware or have been provided a denial letter regarding this. Once she has the info, he hopes she will be able to provide verification that would result in the charges being paid through [facility]   Dermatology: First saw PCP she prescribed medication for skin issue on his scalp/ear area and it has not cleared up and like to see a dermatologist, Dr. Syble Creek.    Pt can be reached at [phone]

## 2020-03-25 NOTE — Telephone Encounter (Signed)
Okay for derm referral

## 2020-03-25 NOTE — Telephone Encounter (Signed)
Spoke with patient and gave the billing department telephone number.

## 2020-03-25 NOTE — Telephone Encounter (Signed)
Referral placed.

## 2020-03-25 NOTE — Telephone Encounter (Signed)
Ok for derm referral. I would advise asking the billing dept about the denial letter.Marland KitchenMarland Kitchen

## 2020-04-08 ENCOUNTER — Telehealth: Payer: Self-pay | Admitting: Dermatology

## 2020-04-08 NOTE — Telephone Encounter (Signed)
Patient returned call and an appointment was scheduled on 07/15/20.

## 2020-04-10 ENCOUNTER — Other Ambulatory Visit: Payer: Self-pay | Admitting: Internal Medicine

## 2020-04-10 DIAGNOSIS — I10 Essential (primary) hypertension: Secondary | ICD-10-CM

## 2020-05-25 ENCOUNTER — Encounter (HOSPITAL_BASED_OUTPATIENT_CLINIC_OR_DEPARTMENT_OTHER): Payer: Self-pay | Admitting: Emergency Medicine

## 2020-05-25 ENCOUNTER — Other Ambulatory Visit: Payer: Self-pay

## 2020-05-25 ENCOUNTER — Emergency Department (HOSPITAL_BASED_OUTPATIENT_CLINIC_OR_DEPARTMENT_OTHER)
Admission: EM | Admit: 2020-05-25 | Discharge: 2020-05-25 | Disposition: A | Discharge status: Home / Self Care | Payer: Medicare Other | Attending: Emergency Medicine | Admitting: Emergency Medicine

## 2020-05-25 DIAGNOSIS — K5641 Fecal impaction: Secondary | ICD-10-CM | POA: Insufficient documentation

## 2020-05-25 DIAGNOSIS — I10 Essential (primary) hypertension: Secondary | ICD-10-CM | POA: Insufficient documentation

## 2020-05-25 DIAGNOSIS — R1030 Lower abdominal pain, unspecified: Secondary | ICD-10-CM | POA: Diagnosis present

## 2020-05-25 DIAGNOSIS — Z79899 Other long term (current) drug therapy: Secondary | ICD-10-CM | POA: Diagnosis not present

## 2020-05-25 DIAGNOSIS — Z7982 Long term (current) use of aspirin: Secondary | ICD-10-CM | POA: Insufficient documentation

## 2020-05-25 DIAGNOSIS — K6289 Other specified diseases of anus and rectum: Secondary | ICD-10-CM | POA: Diagnosis not present

## 2020-05-25 DIAGNOSIS — R000 Tachycardia, unspecified: Secondary | ICD-10-CM | POA: Diagnosis not present

## 2020-05-25 LAB — COMPREHENSIVE METABOLIC PANEL
ALT: 24 U/L (ref 0–44)
AST: 29 U/L (ref 15–41)
Albumin: 4.2 g/dL (ref 3.5–5.0)
Alkaline Phosphatase: 62 U/L (ref 38–126)
Anion gap: 15 (ref 5–15)
BUN: 29 mg/dL — ABNORMAL HIGH (ref 8–23)
CO2: 21 mmol/L — ABNORMAL LOW (ref 22–32)
Calcium: 9.3 mg/dL (ref 8.9–10.3)
Chloride: 93 mmol/L — ABNORMAL LOW (ref 98–111)
Creatinine, Ser: 1.12 mg/dL (ref 0.61–1.24)
GFR calc Af Amer: >60 mL/min (ref >60)
GFR calc non Af Amer: >60 mL/min (ref >60)
Glucose, Bld: 147 mg/dL — ABNORMAL HIGH (ref 70–99)
Potassium: 3.2 mmol/L — ABNORMAL LOW (ref 3.5–5.1)
Sodium: 129 mmol/L — ABNORMAL LOW (ref 135–145)
Total Bilirubin: 1.3 mg/dL — ABNORMAL HIGH (ref 0.3–1.2)
Total Protein: 7.4 g/dL (ref 6.5–8.1)

## 2020-05-25 LAB — CBC
HCT: 48 % (ref 39.0–52.0)
Hemoglobin: 16.9 g/dL (ref 13.0–17.0)
MCH: 30.3 pg (ref 26.0–34.0)
MCHC: 35.2 g/dL (ref 30.0–36.0)
MCV: 86 fL (ref 80.0–100.0)
Platelets: 248 10*3/uL (ref 150–400)
RBC: 5.58 MIL/uL (ref 4.22–5.81)
RDW: 12.5 % (ref 11.5–15.5)
WBC: 19.8 10*3/uL — ABNORMAL HIGH (ref 4.0–10.5)
nRBC: 0 % (ref 0.0–0.2)

## 2020-05-25 LAB — LIPASE, BLOOD: Lipase: 244 U/L — ABNORMAL HIGH (ref 11–51)

## 2020-05-25 NOTE — Discharge Instructions (Signed)
Take 8 scoops of miralax in 32oz of whatever you would like to drink.(Gatorade comes in this size) You can also use a fleets enema which you can buy over the counter at the pharmacy.  Return for worsening abdominal pain, vomiting or fever.  Please return for worsening abdominal pain or if you develop a fever.   Take 4 over the counter ibuprofen tablets 3 times a day or 2 over-the-counter naproxen tablets twice a day for pain. Also take tylenol 1000mg (2 extra strength) four times a day.

## 2020-05-25 NOTE — ED Provider Notes (Signed)
[name] Provider Note   CSN: [id number] Arrival date & time: 05/25/20  1441     History Chief Complaint  Patient presents with  . Abdominal Pain    Dustin Hodges is a 69 y.o. male.  69 yo M with a chief complaints of colicky lower abdominal pain.  Is been going on for a few hours after taking Dulcolax.  States that he has been without a bowel movement for about 48 hours.  Typically goes every day.  Took the instructed dose on the side of the box for Dulcolax.  Since then has had some loose bowel movements and has had some crampy lower abdominal pain.  He has had some nausea but denies vomiting.  Denies fevers.  Has had some mild decreased oral intake over the past couple days.  No prior abdominal surgery.  Had a colonoscopy about 3 months ago that was clean.  The history is provided by the patient.  Abdominal Pain Pain location:  LLQ, RLQ and suprapubic Pain radiates to:  Does not radiate Pain severity:  Moderate Onset quality:  Gradual Duration:  4 hours Timing:  Intermittent Progression:  Waxing and waning Chronicity:  New Context: laxative use   Relieved by:  Nothing Worsened by:  Nothing Ineffective treatments:  None tried Associated symptoms: constipation and nausea   Associated symptoms: no chest pain, no chills, no diarrhea, no fever, no shortness of breath and no vomiting        Past Medical History:  Diagnosis Date  . DDD (degenerative disc disease), lumbar   . GERD (gastroesophageal reflux disease)    possibly and untreated   . Hyperlipidemia   . Hypertension     Patient Active Problem List   Diagnosis Date Noted  . Hypertension   . Hyperlipidemia   . DDD (degenerative disc disease), lumbar     Past Surgical History:  Procedure Laterality Date  . COLONOSCOPY  last 2015-2016   last colon in [place] 2015- normal   . KNEE ARTHROSCOPY     meniscus tear   . POLYPECTOMY  2010   benign - pt unsure of lb path on  polyp        Family History  Problem Relation Age of Onset  . CVA Mother   . Heart failure Father   . Colon cancer Neg Hx   . Colon polyps Neg Hx   . Esophageal cancer Neg Hx   . Rectal cancer Neg Hx   . Stomach cancer Neg Hx     Social History   Tobacco Use  . Smoking status: Never Smoker  . Smokeless tobacco: Never Used  Substance Use Topics  . Alcohol use: Yes    Comment: occasional  . Drug use: Never    Home Medications Prior to Admission medications   Medication Sig Start Date End Date Taking? Authorizing Provider  aspirin EC 81 MG tablet Take 81 mg by mouth daily.    [provider]  Cholecalciferol (VITAMIN D) 50 MCG (2000 UT) tablet Take 2,000 Units by mouth daily.    [provider]  fluticasone (FLONASE) 50 MCG/ACT nasal spray Place 1 spray into both nostrils daily.    [provider]  lisinopril-hydrochlorothiazide (ZESTORETIC) 20-12.5 MG tablet TAKE 1 TABLET BY MOUTH TWICE DAILY 04/10/20   Isaac Bliss, Rayford Halsted, MD  pravastatin (PRAVACHOL) 40 MG tablet Take 1 tablet (40 mg total) by mouth at bedtime. 12/13/19   Isaac Bliss, Rayford Halsted, MD  triamcinolone  cream (KENALOG) 0.1 % Apply 1 application topically 2 (two) times daily. 12/13/19   Isaac Bliss, Rayford Halsted, MD    Allergies    Patient has no known allergies.  Review of Systems   Review of Systems  Constitutional: Negative for chills and fever.  HENT: Negative for congestion and facial swelling.   Eyes: Negative for discharge and visual disturbance.  Respiratory: Negative for shortness of breath.   Cardiovascular: Negative for chest pain and palpitations.  Gastrointestinal: Positive for abdominal pain, constipation and nausea. Negative for diarrhea and vomiting.  Musculoskeletal: Negative for arthralgias and myalgias.  Skin: Negative for color change and rash.  Neurological: Negative for tremors, syncope and headaches.  Psychiatric/Behavioral: Negative for confusion  and dysphoric mood.    Physical Exam Updated Vital Signs BP (!) 109/55 (BP Location: Left Arm)   Pulse 66   Temp 97.8 F (36.6 C) (Oral)   Resp 18   Wt 78.4 kg   SpO2 100%   BMI 25.90 kg/m   Physical Exam Vitals and nursing note reviewed.  Constitutional:      Appearance: He is well-developed.  HENT:     Head: Normocephalic and atraumatic.  Eyes:     Pupils: Pupils are equal, round, and reactive to light.  Neck:     Vascular: No JVD.  Cardiovascular:     Rate and Rhythm: Normal rate and regular rhythm.     Heart sounds: No murmur heard.  No friction rub. No gallop.   Pulmonary:     Effort: No respiratory distress.     Breath sounds: No wheezing.  Abdominal:     General: There is no distension.     Tenderness: There is no abdominal tenderness. There is no guarding or rebound.     Comments: Benign abdominal exam  Genitourinary:    Comments: 2 small tears to the rectal mucosa on the right.  Large clay consistency stool ball felt just at the reach of my finger. Musculoskeletal:        General: Normal range of motion.     Cervical back: Normal range of motion and neck supple.  Skin:    Coloration: Skin is not pale.     Findings: No rash.  Neurological:     Mental Status: He is alert and oriented to person, place, and time.  Psychiatric:        Behavior: Behavior normal.     ED Results / Procedures / Treatments   Labs (all labs ordered are listed, but only abnormal results are displayed) Labs Reviewed  LIPASE, BLOOD - Abnormal; Notable for the following components:      Result Value   Lipase 244 (*)    All other components within normal limits  COMPREHENSIVE METABOLIC PANEL - Abnormal; Notable for the following components:   Sodium 129 (*)    Potassium 3.2 (*)    Chloride 93 (*)    CO2 21 (*)    Glucose, Bld 147 (*)    BUN 29 (*)    Total Bilirubin 1.3 (*)    All other components within normal limits  CBC - Abnormal; Notable for the following components:     WBC 19.8 (*)    All other components within normal limits  URINALYSIS, ROUTINE W REFLEX MICROSCOPIC    EKG None  Radiology No results found.  Procedures Procedures (including critical care time)  Medications Ordered in ED Medications - No data to display  ED Course  I have reviewed the triage  vital signs and the nursing notes.  Pertinent labs & imaging results that were available during my care of the patient were reviewed by me and considered in my medical decision making (see chart for details).    MDM Rules/Calculators/A&P                          69 yo M with a chief complaints of colicky lower abdominal pain.  Going on since he took a Dulcolax.  Patient clinically has a fecal impaction though the stool is just other reach my finger.  I discussed different options with the patient including CT imaging.  He is electing to go home and try a cleanout.  Will return for worsening pain vomiting or fever.  Of note the patient also had a elevated lipase level.  This is of unknown significance.  He had no pain in the epigastrium or right upper quadrant.  Negative Murphy sign.  We will have him follow-up with his family doctor.  5:07 PM:  I have discussed the diagnosis/risks/treatment options with the patient and family and believe the pt to be eligible for discharge home to follow-up with PCP. We also discussed returning to the ED immediately if new or worsening sx occur. We discussed the sx which are most concerning (e.g., sudden worsening pain, fever, inability to tolerate by mouth) that necessitate immediate return. Medications administered to the patient during their visit and any new prescriptions provided to the patient are listed below.  Medications given during this visit Medications - No data to display   The patient appears reasonably screen and/or stabilized for discharge and I doubt any other medical condition or other [facility] requiring further screening, evaluation, or  treatment in the ED at this time prior to discharge.   Final Clinical Impression(s) / ED Diagnoses Final diagnoses:  Fecal impaction in rectum [facility])    Rx / DC Orders ED Discharge Orders    None       Deno Etienne, DO 05/25/20 1708

## 2020-05-25 NOTE — ED Triage Notes (Signed)
Per ems report: Pt from home. Ambulatory. Reports constipation for several days. Took 4 dulcolax this am. C/o intermittent crampy pain, but pain resolves between episodes. Pt in [place] at this time

## 2020-07-10 ENCOUNTER — Other Ambulatory Visit: Payer: Self-pay | Admitting: Internal Medicine

## 2020-07-10 DIAGNOSIS — E785 Hyperlipidemia, unspecified: Secondary | ICD-10-CM

## 2020-07-15 ENCOUNTER — Other Ambulatory Visit: Payer: Self-pay

## 2020-07-15 ENCOUNTER — Encounter: Payer: Self-pay | Admitting: Dermatology

## 2020-07-15 ENCOUNTER — Ambulatory Visit: Payer: Medicare Other | Admitting: Dermatology

## 2020-07-15 DIAGNOSIS — Z1283 Encounter for screening for malignant neoplasm of skin: Secondary | ICD-10-CM

## 2020-07-15 DIAGNOSIS — L219 Seborrheic dermatitis, unspecified: Secondary | ICD-10-CM

## 2020-07-15 DIAGNOSIS — L309 Dermatitis, unspecified: Secondary | ICD-10-CM | POA: Diagnosis not present

## 2020-07-15 DIAGNOSIS — L570 Actinic keratosis: Secondary | ICD-10-CM

## 2020-07-15 MED ORDER — CLOBETASOL PROP EMOLLIENT BASE 0.05 % EX CREA: 1.0000 "application " | TOPICAL_CREAM | Freq: Two times a day (BID) | CUTANEOUS | 2 refills | Status: DC

## 2020-07-16 ENCOUNTER — Encounter: Payer: Self-pay | Admitting: Internal Medicine

## 2020-07-16 DIAGNOSIS — E785 Hyperlipidemia, unspecified: Secondary | ICD-10-CM

## 2020-07-16 DIAGNOSIS — N529 Male erectile dysfunction, unspecified: Secondary | ICD-10-CM

## 2020-07-16 DIAGNOSIS — I10 Essential (primary) hypertension: Secondary | ICD-10-CM

## 2020-07-16 MED ORDER — PRAVASTATIN SODIUM 40 MG PO TABS: ORAL_TABLET | ORAL | 1 refills | Status: DC

## 2020-07-16 MED ORDER — LISINOPRIL-HYDROCHLOROTHIAZIDE 20-12.5 MG PO TABS: 1.0000 | ORAL_TABLET | Freq: Two times a day (BID) | ORAL | 1 refills | Status: DC

## 2020-07-28 NOTE — Addendum Note (Signed)
Addended by: Westley Hummer B on: 07/28/2020 10:19 AM   Modules accepted: Orders

## 2020-08-11 ENCOUNTER — Encounter: Payer: Self-pay | Admitting: Dermatology

## 2020-08-11 NOTE — Progress Notes (Signed)
   Follow-Up Visit   Subjective  Dustin Hodges is a 69 y.o. male who presents for the following: Annual Exam (SCALP X MONTHS ITCH AND FLAKE TX TAR SHAMPOO,SPOT ON NOSE X 1 YEAR CRUST COMES AND GOES, DRAINAGE IN THE EAR WITH ITCH AND FLAKE TREATMENT TAC 0.1%).  General skin examination. Location:  Duration:  Quality:  Associated Signs/Symptoms: Modifying Factors:  Severity:  Timing: Context: Check itchy scaly scalp.  Objective  Well appearing patient in no apparent distress; mood and affect are within normal limits.  A full examination was performed including scalp, head, eyes, ears, nose, lips, neck, chest, axillae, abdomen, back, buttocks, bilateral upper extremities, bilateral lower extremities, hands, feet, fingers, toes, fingernails, and toenails. All findings within normal limits unless otherwise noted below.   Assessment & Plan    Eczema, unspecified type  Ordered Medications: Clobetasol Prop Emollient Base (CLOBETASOL PROPIONATE E) 0.05 % emollient cream  Skin exam for malignant neoplasm Mid Back  Annual skin examination.  Seborrheic dermatitis Mid Parietal Scalp  Topical clobetasol (ideally foam vehicle) daily for 1 month; follow-up at that time by MyChart or phone.  Other Related Medications triamcinolone cream (KENALOG) 0.1 %  AK (actinic keratosis) Right Nasal Sidewall  Return if recurs.  Skin cancer screening performed today.    I, Lavonna Monarch, MD, have reviewed all documentation for this visit.  The documentation on 08/11/20 for the exam, diagnosis, procedures, and orders are all accurate and complete.

## 2020-08-19 ENCOUNTER — Other Ambulatory Visit: Payer: Self-pay | Admitting: Internal Medicine

## 2020-08-19 DIAGNOSIS — I10 Essential (primary) hypertension: Secondary | ICD-10-CM

## 2020-09-01 ENCOUNTER — Encounter: Payer: Self-pay | Admitting: Internal Medicine

## 2020-09-01 DIAGNOSIS — M79675 Pain in left toe(s): Secondary | ICD-10-CM

## 2020-09-15 DIAGNOSIS — N528 Other male erectile dysfunction: Secondary | ICD-10-CM | POA: Diagnosis not present

## 2020-09-16 ENCOUNTER — Encounter: Payer: Self-pay | Admitting: Internal Medicine

## 2020-09-18 ENCOUNTER — Ambulatory Visit (INDEPENDENT_AMBULATORY_CARE_PROVIDER_SITE_OTHER): Payer: Medicare Other

## 2020-09-18 ENCOUNTER — Encounter: Payer: Self-pay | Admitting: Podiatry

## 2020-09-18 ENCOUNTER — Ambulatory Visit: Payer: Medicare Other | Admitting: Podiatry

## 2020-09-18 ENCOUNTER — Other Ambulatory Visit: Payer: Self-pay

## 2020-09-18 DIAGNOSIS — M2011 Hallux valgus (acquired), right foot: Secondary | ICD-10-CM

## 2020-09-18 DIAGNOSIS — M2012 Hallux valgus (acquired), left foot: Secondary | ICD-10-CM

## 2020-09-18 DIAGNOSIS — M205X9 Other deformities of toe(s) (acquired), unspecified foot: Secondary | ICD-10-CM

## 2020-09-18 NOTE — Progress Notes (Signed)
Subjective:  Patient ID: Dustin Hodges, male    DOB: [birth year],  MRN: [id number] HPI Chief Complaint  Patient presents with  . Foot Pain    1st MPJ bilateral (L>R) - bunion deformities x years, having more discomfort over the last few months, redness, swelling, shoe problems, tries to wear crocs a lot, questions regarding long term prognosis  . New Patient (Initial Visit)    69 y.o. male presents with the above complaint.   ROS: Denies fever chills nausea vomiting muscle aches pains calf pain back pain chest pain shortness of breath.  States that he is a very active person does a lot of walking and currently is not in pain but he just would like to make sure that there is nothing that he can do to help prevent this.  Past Medical History:  Diagnosis Date  . DDD (degenerative disc disease), lumbar   . GERD (gastroesophageal reflux disease)    possibly and untreated   . Hyperlipidemia   . Hypertension    Past Surgical History:  Procedure Laterality Date  . COLONOSCOPY  last 2015-2016   last colon in [place] 2015- normal   . KNEE ARTHROSCOPY     meniscus tear   . POLYPECTOMY  2010   benign - pt unsure of lb path on polyp     Current Outpatient Medications:  .  aspirin EC 81 MG tablet, Take 81 mg by mouth daily., Disp: , Rfl:  .  Cholecalciferol (VITAMIN D) 50 MCG (2000 UT) tablet, Take 2,000 Units by mouth daily., Disp: , Rfl:  .  Clobetasol Prop Emollient Base (CLOBETASOL PROPIONATE E) 0.05 % emollient cream, Apply 1 application topically 2 (two) times daily. Onto scalp, Disp: 30 g, Rfl: 2 .  fluticasone (FLONASE) 50 MCG/ACT nasal spray, Place 1 spray into both nostrils daily. (Patient not taking: Reported on 07/15/2020), Disp: , Rfl:  .  lisinopril-hydrochlorothiazide (ZESTORETIC) 20-12.5 MG tablet, TAKE 1 TABLET BY MOUTH TWICE DAILY, Disp: 90 tablet, Rfl: 0 .  pravastatin (PRAVACHOL) 40 MG tablet, TAKE 1 TABLET(40 MG) BY MOUTH AT BEDTIME, Disp: 90 tablet, Rfl: 1 .   sildenafil (REVATIO) 20 MG tablet, SMARTSIG:2 Tablet(s) By Mouth PRN, Disp: , Rfl:  .  triamcinolone cream (KENALOG) 0.1 %, Apply 1 application topically 2 (two) times daily., Disp: 30 g, Rfl: 1  No Known Allergies Review of Systems Objective:  There were no vitals filed for this visit.  General: Well developed, nourished, in no acute distress, alert and oriented x3   Dermatological: Skin is warm, dry and supple bilateral. Nails x 10 are well maintained; remaining integument appears unremarkable at this time. There are no open sores, no preulcerative lesions, no rash or signs of infection present.  Vascular: Dorsalis Pedis artery and Posterior Tibial artery pedal pulses are 2/4 bilateral with immedate capillary fill time. Pedal hair growth present. No varicosities and no lower extremity edema present bilateral.   Neruologic: Grossly intact via light touch bilateral. Vibratory intact via tuning fork bilateral. Protective threshold with Semmes Wienstein monofilament intact to all pedal sites bilateral. Patellar and Achilles deep tendon reflexes 2+ bilateral. No Babinski or clonus noted bilateral.   Musculoskeletal: No gross boney pedal deformities bilateral. No pain, crepitus, or limitation noted with foot and ankle range of motion bilateral. Muscular strength 5/5 in all groups tested bilateral.  Large massive nonpulsatile mass with an elevated first metatarsal when compared to the lesser metatarsals the right foot appears to be a little worse than that of  the left and there is a limited range of motion to about 20 to 30 degrees.  But no pain on palpation.  Gait: Unassisted, Nonantalgic.    Radiographs:  Radiographs taken today demonstrate an osseously mature individual there is some medial calcific sclerosis of some of the vessels.  Most notably there is joint space narrowing subchondral sclerosis and elevated first metatarsal resulting in dorsal spurring and eburnation of the joint.  This is  noted bilaterally.  Assessment & Plan:   Assessment: Hallux limitus osteoarthritis first metatarsophalangeal joint bilaterally.    Plan: Discussed etiology pathology conservative versus surgical therapies at this point we discussed the conservative therapies which may include anti-inflammatories appropriate shoe gear orthotics topical anti-inflammatories and injectable anti-inflammatories.  We also discussed the possible need for surgery in the future consisting of joint replacements or fusion of the joint.  At this point since he is in no pain I recommended that he do nothing other than continue his exercise regimen and a stiffer soled shoe he understands this is amendable to it we will follow-up with [place] as needed.     Bueford Arp T. [place], [place]

## 2020-09-30 ENCOUNTER — Encounter: Payer: Self-pay | Admitting: Internal Medicine

## 2020-10-01 ENCOUNTER — Telehealth: Payer: Medicare Other | Admitting: Internal Medicine

## 2020-10-01 DIAGNOSIS — I10 Essential (primary) hypertension: Secondary | ICD-10-CM | POA: Diagnosis not present

## 2020-10-01 MED ORDER — AMLODIPINE BESYLATE 5 MG PO TABS: 5.0000 mg | ORAL_TABLET | Freq: Every day | ORAL | 1 refills | Status: DC

## 2020-10-01 NOTE — Progress Notes (Signed)
Virtual Visit via Video Note  I connected with Dustin Hodges on 10/01/20 at  3:30 PM EST by a video enabled telemedicine application and verified that I am speaking with the correct person using two identifiers.  Location patient: home Location provider: work office Persons participating in the virtual visit: patient, provider  I discussed the limitations of evaluation and management by telemedicine and the availability of in person appointments. The patient expressed understanding and agreed to proceed.   HPI: He has scheduled this visit as a follow-up for elevated blood pressures that were noted at his last office visit in May.  He is on lisinopril 20/hydrochlorothiazide 12.5 mg that he takes twice daily.  He has a blood pressure log to share with [place] as follows:  147/93 165/85 175/88 160/90 175/92 165/90 175/88 160/85 150/90 145/80  He has been compliant with his current medications, he watches his salt intake religiously.  He remains quite physically active.   ROS: Constitutional: Denies fever, chills, diaphoresis, appetite change and fatigue.  HEENT: Denies photophobia, eye pain, redness, hearing loss, ear pain, congestion, sore throat, rhinorrhea, sneezing, mouth sores, trouble swallowing, neck pain, neck stiffness and tinnitus.   Respiratory: Denies SOB, DOE, cough, chest tightness,  and wheezing.   Cardiovascular: Denies chest pain, palpitations and leg swelling.  Gastrointestinal: Denies nausea, vomiting, abdominal pain, diarrhea, constipation, blood in stool and abdominal distention.  Genitourinary: Denies dysuria, urgency, frequency, hematuria, flank pain and difficulty urinating.  Endocrine: Denies: hot or cold intolerance, sweats, changes in hair or nails, polyuria, polydipsia. Musculoskeletal: Denies myalgias, back pain, joint swelling, arthralgias and gait problem.  Skin: Denies pallor, rash and wound.  Neurological: Denies dizziness, seizures,  syncope, weakness, light-headedness, numbness and headaches.  Hematological: Denies adenopathy. Easy bruising, personal or family bleeding history  Psychiatric/Behavioral: Denies suicidal ideation, mood changes, confusion, nervousness, sleep disturbance and agitation   Past Medical History:  Diagnosis Date  . DDD (degenerative disc disease), lumbar   . GERD (gastroesophageal reflux disease)    possibly and untreated   . Hyperlipidemia   . Hypertension     Past Surgical History:  Procedure Laterality Date  . COLONOSCOPY  last 2015-2016   last colon in [place] 2015- normal   . KNEE ARTHROSCOPY     meniscus tear   . POLYPECTOMY  2010   benign - pt unsure of lb path on polyp     Family History  Problem Relation Age of Onset  . CVA Mother   . Heart failure Father   . Colon cancer Neg Hx   . Colon polyps Neg Hx   . Esophageal cancer Neg Hx   . Rectal cancer Neg Hx   . Stomach cancer Neg Hx     SOCIAL HX:   reports that he has never smoked. He has never used smokeless tobacco. He reports current alcohol use. He reports that he does not use drugs.   Current Outpatient Medications:  .  aspirin EC 81 MG tablet, Take 81 mg by mouth daily., Disp: , Rfl:  .  Cholecalciferol (VITAMIN D) 50 MCG (2000 UT) tablet, Take 2,000 Units by mouth daily., Disp: , Rfl:  .  Clobetasol Prop Emollient Base (CLOBETASOL PROPIONATE E) 0.05 % emollient cream, Apply 1 application topically 2 (two) times daily. Onto scalp, Disp: 30 g, Rfl: 2 .  lisinopril-hydrochlorothiazide (ZESTORETIC) 20-12.5 MG tablet, TAKE 1 TABLET BY MOUTH TWICE DAILY, Disp: 90 tablet, Rfl: 0 .  pravastatin (PRAVACHOL) 40 MG tablet, TAKE 1 TABLET(40  MG) BY MOUTH AT BEDTIME, Disp: 90 tablet, Rfl: 1 .  sildenafil (REVATIO) 20 MG tablet, SMARTSIG:2 Tablet(s) By Mouth PRN, Disp: , Rfl:  .  triamcinolone cream (KENALOG) 0.1 %, Apply 1 application topically 2 (two) times daily., Disp: 30 g, Rfl: 1 .  amLODipine (NORVASC) 5 MG tablet, Take 1  tablet (5 mg total) by mouth daily., Disp: 90 tablet, Rfl: 1  EXAM:   VITALS per patient if applicable: See blood pressure log as above.  GENERAL: alert, oriented, appears well and in no acute distress  HEENT: atraumatic, conjunttiva clear, no obvious abnormalities on inspection of external nose and ears  NECK: normal movements of the head and neck  LUNGS: on inspection no signs of respiratory distress, breathing rate appears normal, no obvious gross increased work of breathing, gasping or wheezing  CV: no obvious cyanosis  MS: moves all visible extremities without noticeable abnormality  PSYCH/NEURO: pleasant and cooperative, no obvious depression or anxiety, speech and thought processing grossly intact  ASSESSMENT AND PLAN:   Primary hypertension  -Not currently well controlled on lisinopril 20 mg/hydrochlorothiazide 12.5 mg that he takes twice daily. -Add amlodipine 5 mg daily. -He will continue to do ambulatory blood pressure monitoring and schedule follow-up visit in 6 to 8 weeks.     I discussed the assessment and treatment plan with the patient. The patient was provided an opportunity to ask questions and all were answered. The patient agreed with the plan and demonstrated an understanding of the instructions.   The patient was advised to call back or seek an in-person evaluation if the symptoms worsen or if the condition fails to improve as anticipated.    Lelon Frohlich, MD  [facility]

## 2020-10-02 ENCOUNTER — Encounter: Payer: Self-pay | Admitting: Internal Medicine

## 2020-10-15 ENCOUNTER — Other Ambulatory Visit: Payer: Self-pay | Admitting: Internal Medicine

## 2020-10-15 DIAGNOSIS — I10 Essential (primary) hypertension: Secondary | ICD-10-CM

## 2020-11-13 ENCOUNTER — Other Ambulatory Visit: Payer: Self-pay

## 2020-11-14 ENCOUNTER — Encounter: Payer: Self-pay | Admitting: Internal Medicine

## 2020-11-14 ENCOUNTER — Ambulatory Visit (INDEPENDENT_AMBULATORY_CARE_PROVIDER_SITE_OTHER): Payer: Medicare Other | Admitting: Internal Medicine

## 2020-11-14 VITALS — BP 150/84 | HR 99 | Temp 97.5°F | Wt 176.8 lb

## 2020-11-14 DIAGNOSIS — I10 Essential (primary) hypertension: Secondary | ICD-10-CM | POA: Diagnosis not present

## 2020-11-14 DIAGNOSIS — N182 Chronic kidney disease, stage 2 (mild): Secondary | ICD-10-CM

## 2020-11-14 LAB — BASIC METABOLIC PANEL
BUN: 25 mg/dL — ABNORMAL HIGH (ref 6–23)
CO2: 27 mEq/L (ref 19–32)
Calcium: 9.7 mg/dL (ref 8.4–10.5)
Chloride: 102 mEq/L (ref 96–112)
Creatinine, Ser: 1.02 mg/dL (ref 0.40–1.50)
GFR: 76.23 mL/min (ref >60.00)
Glucose, Bld: 102 mg/dL — ABNORMAL HIGH (ref 70–99)
Potassium: 4.1 mEq/L (ref 3.5–5.1)
Sodium: 137 mEq/L (ref 135–145)

## 2020-11-14 MED ORDER — AMLODIPINE BESYLATE 10 MG PO TABS: 10.0000 mg | ORAL_TABLET | Freq: Every day | ORAL | 1 refills | Status: DC

## 2020-11-14 NOTE — Addendum Note (Signed)
Addended by: Janann Colonel on: 11/14/2020 10:11 AM   Modules accepted: Orders

## 2020-11-14 NOTE — Patient Instructions (Signed)
-  Nice seeing you today!!  -Lab work today; will notify you once results are available.  -Increase amlodipine from 50 to 10 mg daily.  -Continue taking lisinopril/HCTZ as you are.  -Schedule virtual visit in 6-8 weeks for blood pressure and your physical for May.

## 2020-11-14 NOTE — Progress Notes (Signed)
Established Patient Office Visit     This visit occurred during the SARS-CoV-2 public health emergency.  Safety protocols were in place, including screening questions prior to the visit, additional usage of staff PPE, and extensive cleaning of exam room while observing appropriate contact time as indicated for disinfecting solutions.    CC/Reason for Visit: Blood pressure follow-up  HPI: Dustin Hodges is a 70 y.o. male who is coming in today for the above mentioned reasons.  At last visit we had added amlodipine 5 mg to his lisinopril/HCTZ 20/12.5 mg that he had been taking twice a day due to uncontrolled blood pressures.  His home blood pressure remains around 140/80.  Blood pressure in office today is 150/84.  He has been working hard on low-salt diet.  He is very compliant with his medication.  He has no symptoms.  He is fully vaccinated against COVID.   Past Medical/Surgical History: Past Medical History:  Diagnosis Date  . DDD (degenerative disc disease), lumbar   . GERD (gastroesophageal reflux disease)    possibly and untreated   . Hyperlipidemia   . Hypertension     Past Surgical History:  Procedure Laterality Date  . COLONOSCOPY  last 2015-2016   last colon in [place] 2015- normal   . KNEE ARTHROSCOPY     meniscus tear   . POLYPECTOMY  2010   benign - pt unsure of lb path on polyp     Social History:  reports that he has never smoked. He has never used smokeless tobacco. He reports current alcohol use. He reports that he does not use drugs.  Allergies: No Known Allergies  Family History:  Family History  Problem Relation Age of Onset  . CVA Mother   . Heart failure Father   . Colon cancer Neg Hx   . Colon polyps Neg Hx   . Esophageal cancer Neg Hx   . Rectal cancer Neg Hx   . Stomach cancer Neg Hx      Current Outpatient Medications:  .  aspirin EC 81 MG tablet, Take 81 mg by mouth daily., Disp: , Rfl:  .  Cholecalciferol (VITAMIN D) 50  MCG (2000 UT) tablet, Take 2,000 Units by mouth daily., Disp: , Rfl:  .  Clobetasol Prop Emollient Base (CLOBETASOL PROPIONATE E) 0.05 % emollient cream, Apply 1 application topically 2 (two) times daily. Onto scalp, Disp: 30 g, Rfl: 2 .  lisinopril-hydrochlorothiazide (ZESTORETIC) 20-12.5 MG tablet, TAKE 1 TABLET BY MOUTH TWICE DAILY, Disp: 180 tablet, Rfl: 1 .  pravastatin (PRAVACHOL) 40 MG tablet, TAKE 1 TABLET(40 MG) BY MOUTH AT BEDTIME, Disp: 90 tablet, Rfl: 1 .  sildenafil (REVATIO) 20 MG tablet, SMARTSIG:2 Tablet(s) By Mouth PRN, Disp: , Rfl:  .  triamcinolone cream (KENALOG) 0.1 %, Apply 1 application topically 2 (two) times daily., Disp: 30 g, Rfl: 1 .  amLODipine (NORVASC) 10 MG tablet, Take 1 tablet (10 mg total) by mouth daily., Disp: 90 tablet, Rfl: 1  Review of Systems:  Constitutional: Denies fever, chills, diaphoresis, appetite change and fatigue.  HEENT: Denies photophobia, eye pain, redness, hearing loss, ear pain, congestion, sore throat, rhinorrhea, sneezing, mouth sores, trouble swallowing, neck pain, neck stiffness and tinnitus.   Respiratory: Denies SOB, DOE, cough, chest tightness,  and wheezing.   Cardiovascular: Denies chest pain, palpitations and leg swelling.  Gastrointestinal: Denies nausea, vomiting, abdominal pain, diarrhea, constipation, blood in stool and abdominal distention.  Genitourinary: Denies dysuria, urgency, frequency, hematuria, flank pain and  difficulty urinating.  Endocrine: Denies: hot or cold intolerance, sweats, changes in hair or nails, polyuria, polydipsia. Musculoskeletal: Denies myalgias, back pain, joint swelling, arthralgias and gait problem.  Skin: Denies pallor, rash and wound.  Neurological: Denies dizziness, seizures, syncope, weakness, light-headedness, numbness and headaches.  Hematological: Denies adenopathy. Easy bruising, personal or family bleeding history  Psychiatric/Behavioral: Denies suicidal ideation, mood changes, confusion,  nervousness, sleep disturbance and agitation    Physical Exam: Vitals:   11/14/20 0927  BP: (!) 150/84  Pulse: 99  Temp: (!) 97.5 F (36.4 C)  TempSrc: Oral  SpO2: (!) 59%  Weight: 176 lb 12.8 oz (80.2 kg)    Body mass index is 26.49 kg/m.   Constitutional: NAD, calm, comfortable Eyes: PERRL, lids and conjunctivae normal, wears corrective lenses ENMT: Mucous membranes are moist.  Respiratory: clear to auscultation bilaterally, no wheezing, no crackles. Normal respiratory effort. No accessory muscle use.  Cardiovascular: Regular rate and rhythm, no murmurs / rubs / gallops. No extremity edema. Neurologic: Grossly intact and nonfocal Psychiatric: Normal judgment and insight. Alert and oriented x 3. Normal mood.    Impression and Plan:  Primary hypertension  -Remains uncontrolled. -Increase amlodipine from 5 to 10 mg daily. -Continue lisinopril 20/hydrochlorothiazide 12.5 mg 2 tablets daily. -He will return in 6 to 8 weeks for follow-up.  CKD (chronic kidney disease) stage 2, GFR 60-89 ml/min  - Plan: Basic metabolic panel -Last measured creatinine was 1.120 in July 2021.   Patient Instructions  -Nice seeing you today!!  -Lab work today; will notify you once results are available.  -Increase amlodipine from 50 to 10 mg daily.  -Continue taking lisinopril/HCTZ as you are.  -Schedule virtual visit in 6-8 weeks for blood pressure and your physical for May.     Lelon Frohlich, MD  Primary [facility] at [facility]

## 2020-11-17 ENCOUNTER — Encounter: Payer: Self-pay | Admitting: Internal Medicine

## 2020-11-18 ENCOUNTER — Encounter: Payer: Self-pay | Admitting: Internal Medicine

## 2020-12-18 DIAGNOSIS — N528 Other male erectile dysfunction: Secondary | ICD-10-CM | POA: Diagnosis not present

## 2021-02-06 ENCOUNTER — Other Ambulatory Visit: Payer: Self-pay | Admitting: Internal Medicine

## 2021-02-06 ENCOUNTER — Encounter: Payer: Self-pay | Admitting: Internal Medicine

## 2021-02-06 DIAGNOSIS — E785 Hyperlipidemia, unspecified: Secondary | ICD-10-CM

## 2021-02-06 MED ORDER — PRAVASTATIN SODIUM 40 MG PO TABS: 40.0000 mg | ORAL_TABLET | Freq: Every day | ORAL | 1 refills | Status: DC

## 2021-02-10 ENCOUNTER — Encounter: Payer: Self-pay | Admitting: Internal Medicine

## 2021-02-10 ENCOUNTER — Other Ambulatory Visit: Payer: Self-pay | Admitting: Internal Medicine

## 2021-02-10 DIAGNOSIS — U071 COVID-19: Secondary | ICD-10-CM

## 2021-02-11 ENCOUNTER — Telehealth: Payer: Self-pay

## 2021-02-11 ENCOUNTER — Other Ambulatory Visit: Payer: Self-pay | Admitting: Adult Health

## 2021-02-11 ENCOUNTER — Encounter: Payer: Self-pay | Admitting: Adult Health

## 2021-02-11 ENCOUNTER — Other Ambulatory Visit (HOSPITAL_BASED_OUTPATIENT_CLINIC_OR_DEPARTMENT_OTHER): Payer: Self-pay

## 2021-02-11 DIAGNOSIS — U071 COVID-19: Secondary | ICD-10-CM

## 2021-02-11 MED ORDER — NIRMATRELVIR/RITONAVIR (PAXLOVID)TABLET
3.0000 | ORAL_TABLET | Freq: Two times a day (BID) | ORAL | 0 refills | Status: AC
  Filled 2021-02-11: qty 30, 5d supply, fill #0

## 2021-02-11 NOTE — Progress Notes (Signed)
Outpatient Oral COVID Treatment Note  I connected with Christean Leaf on 02/11/2021/10:55 AM by telephone and verified that I am speaking with the correct person using two identifiers.  I discussed the limitations, risks, security, and privacy concerns of performing an evaluation and management service by telephone and the availability of in person appointments. I also discussed with the patient that there may be a patient responsible charge related to this service. The patient expressed understanding and agreed to proceed.  Patient location: home Provider location: [facility] office  Diagnosis: COVID-19 infection  Purpose of visit: Discussion of potential use of Molnupiravir or Paxlovid, a new treatment for mild to moderate COVID-19 viral infection in non-hospitalized patients.   Subjective: Patient is a 70 y.o. male who has been diagnosed with COVID 19 viral infection.  Their symptoms began on 02/09/2021 with nasal congestion.    Past Medical History:  Diagnosis Date  . DDD (degenerative disc disease), lumbar   . GERD (gastroesophageal reflux disease)    possibly and untreated   . Hyperlipidemia   . Hypertension     No Known Allergies   Current Outpatient Medications:  .  amLODipine (NORVASC) 10 MG tablet, Take 1 tablet (10 mg total) by mouth daily., Disp: 90 tablet, Rfl: 1 .  aspirin EC 81 MG tablet, Take 81 mg by mouth daily., Disp: , Rfl:  .  Cholecalciferol (VITAMIN D) 50 MCG (2000 UT) tablet, Take 2,000 Units by mouth daily., Disp: , Rfl:  .  Clobetasol Prop Emollient Base (CLOBETASOL PROPIONATE E) 0.05 % emollient cream, Apply 1 application topically 2 (two) times daily. Onto scalp, Disp: 30 g, Rfl: 2 .  lisinopril-hydrochlorothiazide (ZESTORETIC) 20-12.5 MG tablet, TAKE 1 TABLET BY MOUTH TWICE DAILY, Disp: 180 tablet, Rfl: 1 .  pravastatin (PRAVACHOL) 40 MG tablet, Take 1 tablet (40 mg total) by mouth at bedtime., Disp: 90 tablet, Rfl: 1 .  sildenafil (REVATIO) 20  MG tablet, SMARTSIG:2 Tablet(s) By Mouth PRN, Disp: , Rfl:  .  triamcinolone cream (KENALOG) 0.1 %, Apply 1 application topically 2 (two) times daily., Disp: 30 g, Rfl: 1  Objective: Patient appears/sounds moderately well.  They are in no apparent distress.  Breathing is non labored.  Mood and behavior are normal.  Laboratory Data:  Recent Results (from the past 2160 hour(s))  Basic metabolic panel     Status: Abnormal   Collection Time: 11/14/20 10:06 AM  Result Value Ref Range   Sodium 137 135 - 145 mEq/L   Potassium 4.1 3.5 - 5.1 mEq/L   Chloride 102 96 - 112 mEq/L   CO2 27 19 - 32 mEq/L   Glucose, Bld 102 (H) 70 - 99 mg/dL   BUN 25 (H) 6 - 23 mg/dL   Creatinine, Ser 1.02 0.40 - 1.50 mg/dL   GFR 76.23 >60.00 mL/min    Comment: Calculated using the CKD-EPI Creatinine Equation (2021)   Calcium 9.7 8.4 - 10.5 mg/dL     Assessment: 70 y.o. male with mild/moderate COVID 19 viral infection diagnosed on 02/09/2021 at high risk for progression to severe COVID 19.  Plan:  This patient is a 70 y.o. male that meets the following criteria for Emergency Use Authorization of: Paxlovid 1. Age >12 yr AND > 40 kg 2. SARS-COV-2 positive test 3. Symptom onset < 5 days 4. Mild-to-moderate COVID disease with high risk for severe progression to hospitalization or death  I have spoken and communicated the following to the patient or parent/caregiver regarding: 1. Paxlovid is an  unapproved drug that is authorized for use under an Emergency Use Authorization.  2. There are no adequate, approved, available products for the treatment of COVID-19 in adults who have mild-to-moderate COVID-19 and are at high risk for progressing to severe COVID-19, including hospitalization or death. 3. Other therapeutics are currently authorized. For additional information on all products authorized for treatment or prevention of COVID-19, please see  [email or web address].  4. There are benefits and risks of taking this treatment as outlined in the "Fact Sheet for Patients and Caregivers."  5. "Fact Sheet for Patients and Caregivers" was reviewed with patient. A hard copy will be provided to patient from pharmacy prior to the patient receiving treatment. 6. Patients should continue to self-isolate and use infection control measures (e.g., wear mask, isolate, social distance, avoid sharing personal items, clean and disinfect "high touch" surfaces, and frequent handwashing) according to CDC guidelines.  7. The patient or parent/caregiver has the option to accept or refuse treatment. 8. Patient medication history was reviewed for potential drug interactions:Interaction with home meds: Pravastatin, was recommended to hold Pravastatin while taking the Paxlovid. 9. Patient's GFR was calculated to be 76, and they were therefore prescribed Normal dose (GFR>60) - nirmatrelvir 150mg  tab (2 tablet) by mouth twice daily AND ritonavir 100mg  tab (1 tablet) by mouth twice daily   After reviewing above information with the patient, the patient agrees to receive Paxlovid.  Follow up instructions:    . Take prescription BID x 5 days as directed . Reach out to pharmacist for counseling on medication if desired . For concerns regarding further COVID symptoms please follow up with your PCP or urgent care . For urgent or life-threatening issues, seek care at your local emergency department  The patient was provided an opportunity to ask questions, and all were answered. The patient agreed with the plan and demonstrated an understanding of the instructions.   Script [place] [facility] [place] and opted to pick up RX.  The patient was advised to call their PCP or seek an in-person evaluation if the symptoms worsen or if the condition fails to improve as  anticipated.   I provided 15 minutes of non face-to-face telephone visit time during this encounter, and > 50% was spent counseling as documented under my assessment & plan.  Scot Dock, NP 02/11/2021 /10:55 AM

## 2021-02-11 NOTE — Telephone Encounter (Signed)
Called to discuss with patient about COVID-19 symptoms and the use of one of the available treatments for those with mild to moderate Covid symptoms and at a high risk of hospitalization.  Pt appears to qualify for outpatient treatment due to co-morbid conditions and/or a member of an at-risk group in accordance with the FDA Emergency Use Authorization.    Symptom onset: Cold symptoms, body aches 02/09/21 Vaccinated: Yes Booster? Yes Immunocompromised? No Qualifiers: HTN,CKD  Pt. Would like to speak with APP.   Marcello Moores

## 2021-02-13 ENCOUNTER — Emergency Department (HOSPITAL_BASED_OUTPATIENT_CLINIC_OR_DEPARTMENT_OTHER)
Admission: EM | Admit: 2021-02-13 | Discharge: 2021-02-13 | Disposition: A | Discharge status: Home / Self Care | Payer: Medicare Other | Attending: Emergency Medicine | Admitting: Emergency Medicine

## 2021-02-13 ENCOUNTER — Other Ambulatory Visit: Payer: Self-pay

## 2021-02-13 ENCOUNTER — Emergency Department (HOSPITAL_BASED_OUTPATIENT_CLINIC_OR_DEPARTMENT_OTHER): Payer: Medicare Other

## 2021-02-13 ENCOUNTER — Encounter (HOSPITAL_BASED_OUTPATIENT_CLINIC_OR_DEPARTMENT_OTHER): Payer: Self-pay | Admitting: *Deleted

## 2021-02-13 DIAGNOSIS — Z79899 Other long term (current) drug therapy: Secondary | ICD-10-CM | POA: Insufficient documentation

## 2021-02-13 DIAGNOSIS — Z7982 Long term (current) use of aspirin: Secondary | ICD-10-CM | POA: Insufficient documentation

## 2021-02-13 DIAGNOSIS — I129 Hypertensive chronic kidney disease with stage 1 through stage 4 chronic kidney disease, or unspecified chronic kidney disease: Secondary | ICD-10-CM | POA: Insufficient documentation

## 2021-02-13 DIAGNOSIS — R42 Dizziness and giddiness: Secondary | ICD-10-CM | POA: Diagnosis not present

## 2021-02-13 DIAGNOSIS — U071 COVID-19: Secondary | ICD-10-CM | POA: Diagnosis not present

## 2021-02-13 DIAGNOSIS — N182 Chronic kidney disease, stage 2 (mild): Secondary | ICD-10-CM | POA: Insufficient documentation

## 2021-02-13 LAB — COMPREHENSIVE METABOLIC PANEL
ALT: 19 U/L (ref 0–44)
AST: 19 U/L (ref 15–41)
Albumin: 3.7 g/dL (ref 3.5–5.0)
Alkaline Phosphatase: 54 U/L (ref 38–126)
Anion gap: 9 (ref 5–15)
BUN: 16 mg/dL (ref 8–23)
CO2: 25 mmol/L (ref 22–32)
Calcium: 8.8 mg/dL — ABNORMAL LOW (ref 8.9–10.3)
Chloride: 94 mmol/L — ABNORMAL LOW (ref 98–111)
Creatinine, Ser: 1.08 mg/dL (ref 0.61–1.24)
GFR, Estimated: >60 mL/min (ref >60)
Glucose, Bld: 101 mg/dL — ABNORMAL HIGH (ref 70–99)
Potassium: 3.9 mmol/L (ref 3.5–5.1)
Sodium: 128 mmol/L — ABNORMAL LOW (ref 135–145)
Total Bilirubin: 0.7 mg/dL (ref 0.3–1.2)
Total Protein: 7.3 g/dL (ref 6.5–8.1)

## 2021-02-13 LAB — CBC WITH DIFFERENTIAL/PLATELET
Abs Immature Granulocytes: 0.03 10*3/uL (ref 0.00–0.07)
Basophils Absolute: 0 10*3/uL (ref 0.0–0.1)
Basophils Relative: 0 %
Eosinophils Absolute: 0.1 10*3/uL (ref 0.0–0.5)
Eosinophils Relative: 1 %
HCT: 44.7 % (ref 39.0–52.0)
Hemoglobin: 15.8 g/dL (ref 13.0–17.0)
Immature Granulocytes: 0 %
Lymphocytes Relative: 11 %
Lymphs Abs: 0.9 10*3/uL (ref 0.7–4.0)
MCH: 30.2 pg (ref 26.0–34.0)
MCHC: 35.3 g/dL (ref 30.0–36.0)
MCV: 85.3 fL (ref 80.0–100.0)
Monocytes Absolute: 1.1 10*3/uL — ABNORMAL HIGH (ref 0.1–1.0)
Monocytes Relative: 14 %
Neutro Abs: 6.3 10*3/uL (ref 1.7–7.7)
Neutrophils Relative %: 74 %
Platelets: 187 10*3/uL (ref 150–400)
RBC: 5.24 MIL/uL (ref 4.22–5.81)
RDW: 12.5 % (ref 11.5–15.5)
WBC: 8.5 10*3/uL (ref 4.0–10.5)
nRBC: 0 % (ref 0.0–0.2)

## 2021-02-13 LAB — TROPONIN I (HIGH SENSITIVITY)
Troponin I (High Sensitivity): 2 ng/L (ref <18)
Troponin I (High Sensitivity): 3 ng/L (ref <18)

## 2021-02-13 LAB — SARS CORONAVIRUS 2 (TAT 6-24 HRS): SARS Coronavirus 2: POSITIVE — AB

## 2021-02-13 LAB — D-DIMER, QUANTITATIVE: D-Dimer, Quant: 0.42 ug/mL-FEU (ref 0.00–0.50)

## 2021-02-13 NOTE — ED Provider Notes (Signed)
I provided a substantive portion of the care of this patient.  I personally performed the entirety of the history for this encounter.  EKG Interpretation  Date/Time:  Friday February 13 2021 12:05:19 EDT Ventricular Rate:  64 PR Interval:  163 QRS Duration: 85 QT Interval:  401 QTC Calculation: 414 R Axis:   63 Text Interpretation: Sinus rhythm normal Confirmed by Charlesetta Shanks [id number]) on 02/13/2021 2:14:59 PM  Patient woke with some upper respiratory symptoms and achiness Tuesday.  He reports he took a home COVID test that was faintly positive.  He contacted his doctor and was started on antiviral therapy, Paxlovid.  This morning he got up and went to the kitchen to make some food.  He felt lightheaded and then sweaty with some dizziness.  He thought if he passed out he would fall to the floor so he sat on the kitchen table and put his head down for a few minutes.  He did check his blood pressure and systolic blood pressure was 70 at that moment.  He reports after his symptoms passed he felt better.  He contacted his provider and was advised to come to the emergency department for evaluation.  He reports since those symptoms passed, he has felt absolutely fine.  There is no focal weakness numbness tingling or incoordination.  No headache.  Patient is alert nontoxic.  Speech is clear.  Cognitive function normal.  No respiratory distress.  Finger-nose exam is normal.  Movements are all coordinated purposeful symmetric.  At this time I suspect vasovagal episode with orthostatic hypotension.  Patient has been started on antiviral therapy for COVID symptoms were mild.  Patient does not think he had a confirmatory COVID test before starting antiviral therapy we will proceed with a COVID test through the emergency department.  If negative, patient can consider discontinuing his antiviral therapy or review this with his doctor.   Charlesetta Shanks, MD 02/13/21 1431

## 2021-02-13 NOTE — ED Triage Notes (Signed)
This am began feeling for dizziness, light headed, clammy skin, all lasting approx 3-62min, states his BP is low

## 2021-02-13 NOTE — Discharge Instructions (Addendum)
Lab work and imaging all look reassuring.  Please continue taking your medications as prescribed.  Please stay hydrated.  Your Covid test is pain at this time, please check your MyChart within the next 6 to 24 hours to see results.  If your Covid test is negative please discontinue your antiviral treatment.  If positive please continue taking your medications.  Follow-up with your PCP as needed.  Come back to the emergency department if you develop chest pain, shortness of breath, severe abdominal pain, uncontrolled nausea, vomiting, diarrhea.

## 2021-02-13 NOTE — ED Provider Notes (Signed)
[facility] EMERGENCY DEPARTMENT Provider Note   CSN: [id number] Arrival date & time: 02/13/21  1027     History Chief Complaint  Patient presents with  . Dizziness    Dustin Hodges is a 70 y.o. male.  HPI   Patient with significant medical history of hypertension GERD presents with chief complaint of lightheadedness and dizziness.  He endorses  today he was at his kitchen and started to feel lightheaded, became flushed, became nausea without vomiting, had to sit down.  It was not associated with chest pain or shortness of breath and this resolved within 3 to 5 minutes.  He states since then he has no other complaints at this time, symptoms have fully resolved.  Patient has no cardiac history, no history of PEs or DVTs, currently not on hormone therapy. He was recent diagnosed with COVID few days ago is currently taking an antiviral, he states he did not take his blood pressure medications today and did not eat prior to this incident.  He denies orthopnea or worsening pedal edema, states this has never happen in the past.  Patient denies headaches, fevers, chills, shortness of breath, chest pain, abdominal pain, nausea, vomit, diarrhea, worsening pedal edema.   Past Medical History:  Diagnosis Date  . DDD (degenerative disc disease), lumbar   . GERD (gastroesophageal reflux disease)    possibly and untreated   . Hyperlipidemia   . Hypertension     Patient Active Problem List   Diagnosis Date Noted  . CKD (chronic kidney disease) stage 2, GFR 60-89 ml/min 11/14/2020  . Hypertension   . Hyperlipidemia   . DDD (degenerative disc disease), lumbar     Past Surgical History:  Procedure Laterality Date  . COLONOSCOPY  last 2015-2016   last colon in [place] 2015- normal   . KNEE ARTHROSCOPY     meniscus tear   . POLYPECTOMY  2010   benign - pt unsure of lb path on polyp        Family History  Problem Relation Age of Onset  . CVA Mother   . Heart failure  Father   . Colon cancer Neg Hx   . Colon polyps Neg Hx   . Esophageal cancer Neg Hx   . Rectal cancer Neg Hx   . Stomach cancer Neg Hx     Social History   Tobacco Use  . Smoking status: Never Smoker  . Smokeless tobacco: Never Used  Vaping Use  . Vaping Use: Never used  Substance Use Topics  . Alcohol use: Yes    Comment: occasional  . Drug use: Never    Home Medications Prior to Admission medications   Medication Sig Start Date End Date Taking? Authorizing Provider  amLODipine (NORVASC) 10 MG tablet Take 1 tablet (10 mg total) by mouth daily. 11/14/20  Yes Isaac Bliss, Rayford Halsted, MD  aspirin EC 81 MG tablet Take 81 mg by mouth daily.   Yes [provider]  Cholecalciferol (VITAMIN D) 50 MCG (2000 UT) tablet Take 2,000 Units by mouth daily.   Yes [provider]  lisinopril-hydrochlorothiazide (ZESTORETIC) 20-12.5 MG tablet TAKE 1 TABLET BY MOUTH TWICE DAILY 10/16/20  Yes Isaac Bliss, Rayford Halsted, MD  nirmatrelvir/ritonavir EUA (PAXLOVID) TABS Take 3 tablets by mouth 2 (two) times daily for 5 days. 02/11/21 02/16/21 Yes Causey, Charlestine Massed, NP  pravastatin (PRAVACHOL) 40 MG tablet Take 1 tablet (40 mg total) by mouth at bedtime. 02/06/21  Yes Isaac Bliss, Rayford Halsted,  MD  Clobetasol Prop Emollient Base (CLOBETASOL PROPIONATE E) 0.05 % emollient cream Apply 1 application topically 2 (two) times daily. Onto scalp 07/15/20   Lavonna Monarch, MD  triamcinolone cream (KENALOG) 0.1 % Apply 1 application topically 2 (two) times daily. 12/13/19   Isaac Bliss, Rayford Halsted, MD    Allergies    Patient has no known allergies.  Review of Systems   Review of Systems  Constitutional: Negative for chills and fever.  HENT: Negative for congestion.   Respiratory: Negative for shortness of breath.   Cardiovascular: Negative for chest pain.  Gastrointestinal: Negative for abdominal pain, diarrhea, nausea and vomiting.  Genitourinary: Negative for enuresis.   Musculoskeletal: Negative for back pain.  Skin: Negative for rash.  Neurological: Positive for dizziness.  Hematological: Does not bruise/bleed easily.    Physical Exam Updated Vital Signs BP 115/79   Pulse 64   Temp 97.8 F (36.6 C) (Oral)   Resp 20   Ht 5\' 9"  (1.753 m)   Wt 79.4 kg   SpO2 100%   BMI 25.84 kg/m   Physical Exam Vitals and nursing note reviewed.  Constitutional:      General: He is not in acute distress.    Appearance: He is not ill-appearing.  HENT:     Head: Normocephalic and atraumatic.     Nose: No congestion.  Eyes:     Conjunctiva/sclera: Conjunctivae normal.  Neck:     Vascular: No carotid bruit.  Cardiovascular:     Rate and Rhythm: Normal rate and regular rhythm.     Pulses: Normal pulses.     Heart sounds: No murmur heard. No friction rub. No gallop.   Pulmonary:     Effort: No respiratory distress.     Breath sounds: No wheezing, rhonchi or rales.  Abdominal:     Palpations: Abdomen is soft.     Tenderness: There is no abdominal tenderness.  Musculoskeletal:     Cervical back: Normal range of motion.     Right lower leg: No edema.     Left lower leg: No edema.  Skin:    General: Skin is warm and dry.  Neurological:     Mental Status: He is alert.  Psychiatric:        Mood and Affect: Mood normal.     ED Results / Procedures / Treatments   Labs (all labs ordered are listed, but only abnormal results are displayed) Labs Reviewed  CBC WITH DIFFERENTIAL/PLATELET - Abnormal; Notable for the following components:      Result Value   Monocytes Absolute 1.1 (*)    All other components within normal limits  COMPREHENSIVE METABOLIC PANEL - Abnormal; Notable for the following components:   Sodium 128 (*)    Chloride 94 (*)    Glucose, Bld 101 (*)    Calcium 8.8 (*)    All other components within normal limits  SARS CORONAVIRUS 2 (TAT 6-24 HRS)  D-DIMER, QUANTITATIVE  TROPONIN I (HIGH SENSITIVITY)  TROPONIN I (HIGH SENSITIVITY)     EKG EKG Interpretation  Date/Time:  Friday February 13 2021 12:05:19 EDT Ventricular Rate:  64 PR Interval:  163 QRS Duration: 85 QT Interval:  401 QTC Calculation: 414 R Axis:   63 Text Interpretation: Sinus rhythm normal Confirmed by Charlesetta Shanks [phone]) on 02/13/2021 2:14:59 PM   Radiology DG Chest 1 View  Result Date: 02/13/2021 CLINICAL DATA:  Dizziness and lightheadedness beginning this morning. EXAM: CHEST  1 VIEW COMPARISON:  None. FINDINGS: Heart,  mediastinum and hila are unremarkable. Clear lungs.  No pleural effusion or pneumothorax. Skeletal structures are grossly intact. IMPRESSION: No active disease. Electronically Signed   By: Lajean Manes M.D.   On: 02/13/2021 12:32    Procedures Procedures   Medications Ordered in ED Medications - No data to display  ED Course  I have reviewed the triage vital signs and the nursing notes.  Pertinent labs & imaging results that were available during my care of the patient were reviewed by me and considered in my medical decision making (see chart for details).    MDM Rules/Calculators/A&P                         Initial impression-patient presents with dizziness which has since resolved.  He is alert, does not appear in acute distress, vital signs reassuring.  Suspect this may be vasovagal, dehydration will obtain chest pain work-up, EKG, chest x-ray, add on D-dimer for PE exclusion.  Work-up-CBC unremarkable, CMP shows hyponatremia of 128, appears to be baseline for patient, D-dimer 0.42, troponin 2, second troponin is 3, chest x-ray negative for acute findings.  EKG sinus without signs of ischemia no ST elevation depression noted.  Orthostatics negative  Reassessment patient was updated on lab work imaging, continues to have no complaints this time, vital signs remained stable.  Patient agreeable for discharge at this time.  Rule out-I have low suspicion for ACS as history is atypical, patient has no cardiac history,  EKG was sinus rhythm without signs of ischemia, patient had a negative delta troponin.  Low suspicion for PE as patient denies pleuritic chest pain, shortness of breath, patient denies leg pain, no pedal edema noted on exam, D-dimer negative.  Low suspicion for AAA or aortic dissection as history is atypical, patient has low risk factors.  Low suspicion for systemic infection as patient is nontoxic-appearing, vital signs reassuring, no obvious source infection noted on exam.  Low suspicion for CVA or intracranial head bleed as patient denies headaches, change in vision, paresthesia weakness upper lower extremities, patient is moving all extremities without difficulty.  Face is symmetrical   Plan-suspect patient suffering from possible dehydration versus vasovagal hypotension, will recommend he continue with his medications, stay hydrated, follow-up with PCP for further evaluation.  Vital signs have remained stable, no indication for hospital admission.  Patient discussed with attending and they agreed with assessment and plan.  Patient given at home care as well strict return precautions.  Patient verbalized that they understood agreed to said plan.   Final Clinical Impression(s) / ED Diagnoses Final diagnoses:  Dizziness    Rx / DC Orders ED Discharge Orders    None       Marcello Fennel, PA-C 02/13/21 1443    Charlesetta Shanks, MD 03/03/21 1455

## 2021-03-02 DIAGNOSIS — H2513 Age-related nuclear cataract, bilateral: Secondary | ICD-10-CM | POA: Diagnosis not present

## 2021-03-12 ENCOUNTER — Ambulatory Visit (INDEPENDENT_AMBULATORY_CARE_PROVIDER_SITE_OTHER): Payer: Medicare Other | Admitting: Internal Medicine

## 2021-03-12 ENCOUNTER — Encounter: Payer: Self-pay | Admitting: Internal Medicine

## 2021-03-12 ENCOUNTER — Other Ambulatory Visit: Payer: Self-pay

## 2021-03-12 VITALS — BP 110/80 | HR 79 | Temp 97.7°F | Ht 68.0 in | Wt 172.9 lb

## 2021-03-12 DIAGNOSIS — E785 Hyperlipidemia, unspecified: Secondary | ICD-10-CM | POA: Diagnosis not present

## 2021-03-12 DIAGNOSIS — I10 Essential (primary) hypertension: Secondary | ICD-10-CM

## 2021-03-12 DIAGNOSIS — Z0000 Encounter for general adult medical examination without abnormal findings: Secondary | ICD-10-CM | POA: Diagnosis not present

## 2021-03-12 DIAGNOSIS — N182 Chronic kidney disease, stage 2 (mild): Secondary | ICD-10-CM

## 2021-03-12 LAB — CBC WITH DIFFERENTIAL/PLATELET
Basophils Absolute: 0 10*3/uL (ref 0.0–0.1)
Basophils Relative: 0.5 % (ref 0.0–3.0)
Eosinophils Absolute: 0.2 10*3/uL (ref 0.0–0.7)
Eosinophils Relative: 1.9 % (ref 0.0–5.0)
HCT: 44.4 % (ref 39.0–52.0)
Hemoglobin: 15.6 g/dL (ref 13.0–17.0)
Lymphocytes Relative: 17.3 % (ref 12.0–46.0)
Lymphs Abs: 1.7 10*3/uL (ref 0.7–4.0)
MCHC: 35.2 g/dL (ref 30.0–36.0)
MCV: 84.6 fl (ref 78.0–100.0)
Monocytes Absolute: 0.7 10*3/uL (ref 0.1–1.0)
Monocytes Relative: 7.5 % (ref 3.0–12.0)
Neutro Abs: 7 10*3/uL (ref 1.4–7.7)
Neutrophils Relative %: 72.8 % (ref 43.0–77.0)
Platelets: 246 10*3/uL (ref 150.0–400.0)
RBC: 5.25 Mil/uL (ref 4.22–5.81)
RDW: 13 % (ref 11.5–15.5)
WBC: 9.6 10*3/uL (ref 4.0–10.5)

## 2021-03-12 LAB — VITAMIN B12: Vitamin B-12: 587 pg/mL (ref 211–911)

## 2021-03-12 LAB — TSH: TSH: 1.61 u[IU]/mL (ref 0.35–4.50)

## 2021-03-12 LAB — HEMOGLOBIN A1C: Hgb A1c MFr Bld: 5.1 % (ref 4.6–6.5)

## 2021-03-12 LAB — VITAMIN D 25 HYDROXY (VIT D DEFICIENCY, FRACTURES): VITD: 58.61 ng/mL (ref 30.00–100.00)

## 2021-03-12 NOTE — Progress Notes (Signed)
Established Patient Office Visit     This visit occurred during the SARS-CoV-2 public health emergency.  Safety protocols were in place, including screening questions prior to the visit, additional usage of staff PPE, and extensive cleaning of exam room while observing appropriate contact time as indicated for disinfecting solutions.    CC/Reason for Visit: Annual preventive exam and subsequent Medicare wellness visit  HPI: Dustin Hodges is a 70 y.o. male who is coming in today for the above mentioned reasons. Past Medical History is significant for: Hypertension and hyperlipidemia.  He has been in good health.  He has routine eye and dental care.  No perceived hearing issues, he has routine exercise.  He has had all 4 COVID vaccines, had his first shingles, is now due for his second, Tdap flu and pneumonia vaccinations are up-to-date.  He had a colonoscopy in 2021 and is a 5-year callback due to a history of polyps.   Past Medical/Surgical History: Past Medical History:  Diagnosis Date  . DDD (degenerative disc disease), lumbar   . GERD (gastroesophageal reflux disease)    possibly and untreated   . Hyperlipidemia   . Hypertension     Past Surgical History:  Procedure Laterality Date  . COLONOSCOPY  last 2015-2016   last colon in [place] 2015- normal   . KNEE ARTHROSCOPY     meniscus tear   . POLYPECTOMY  2010   benign - pt unsure of lb path on polyp     Social History:  reports that he has never smoked. He has never used smokeless tobacco. He reports current alcohol use. He reports that he does not use drugs.  Allergies: No Known Allergies  Family History:  Family History  Problem Relation Age of Onset  . CVA Mother   . Heart failure Father   . Colon cancer Neg Hx   . Colon polyps Neg Hx   . Esophageal cancer Neg Hx   . Rectal cancer Neg Hx   . Stomach cancer Neg Hx      Current Outpatient Medications:  .  amLODipine (NORVASC) 10 MG tablet, Take 1  tablet (10 mg total) by mouth daily., Disp: 90 tablet, Rfl: 1 .  aspirin EC 81 MG tablet, Take 81 mg by mouth daily., Disp: , Rfl:  .  Cholecalciferol (VITAMIN D) 50 MCG (2000 UT) tablet, Take 2,000 Units by mouth daily., Disp: , Rfl:  .  Clobetasol Prop Emollient Base (CLOBETASOL PROPIONATE E) 0.05 % emollient cream, Apply 1 application topically 2 (two) times daily. Onto scalp, Disp: 30 g, Rfl: 2 .  lisinopril-hydrochlorothiazide (ZESTORETIC) 20-12.5 MG tablet, TAKE 1 TABLET BY MOUTH TWICE DAILY, Disp: 180 tablet, Rfl: 1 .  pravastatin (PRAVACHOL) 40 MG tablet, Take 1 tablet (40 mg total) by mouth at bedtime., Disp: 90 tablet, Rfl: 1 .  triamcinolone cream (KENALOG) 0.1 %, Apply 1 application topically 2 (two) times daily., Disp: 30 g, Rfl: 1  Review of Systems:  Constitutional: Denies fever, chills, diaphoresis, appetite change and fatigue.  HEENT: Denies photophobia, eye pain, redness, hearing loss, ear pain, congestion, sore throat, rhinorrhea, sneezing, mouth sores, trouble swallowing, neck pain, neck stiffness and tinnitus.   Respiratory: Denies SOB, DOE, cough, chest tightness,  and wheezing.   Cardiovascular: Denies chest pain, palpitations and leg swelling.  Gastrointestinal: Denies nausea, vomiting, abdominal pain, diarrhea, constipation, blood in stool and abdominal distention.  Genitourinary: Denies dysuria, urgency, frequency, hematuria, flank pain and difficulty urinating.  Endocrine: Denies: hot  or cold intolerance, sweats, changes in hair or nails, polyuria, polydipsia. Musculoskeletal: Denies myalgias, back pain, joint swelling, arthralgias and gait problem.  Skin: Denies pallor, rash and wound.  Neurological: Denies dizziness, seizures, syncope, weakness, light-headedness, numbness and headaches.  Hematological: Denies adenopathy. Easy bruising, personal or family bleeding history  Psychiatric/Behavioral: Denies suicidal ideation, mood changes, confusion, nervousness, sleep  disturbance and agitation    Physical Exam: Vitals:   03/12/21 1303  BP: 110/80  Pulse: 79  Temp: 97.7 F (36.5 C)  TempSrc: Oral  SpO2: 94%  Weight: 172 lb 14.4 oz (78.4 kg)  Height: 5\' 8"  (1.727 m)    Body mass index is 26.29 kg/m.   Constitutional: NAD, calm, comfortable Eyes: PERRL, lids and conjunctivae normal, wears corrective lenses ENMT: Mucous membranes are moist. Posterior pharynx clear of any exudate or lesions. Normal dentition. Tympanic membrane is pearly white, no erythema or bulging. Neck: normal, supple, no masses, no thyromegaly Respiratory: clear to auscultation bilaterally, no wheezing, no crackles. Normal respiratory effort. No accessory muscle use.  Cardiovascular: Regular rate and rhythm, no murmurs / rubs / gallops. No extremity edema. 2+ pedal pulses. No carotid bruits.  Abdomen: no tenderness, no masses palpated. No hepatosplenomegaly. Bowel sounds positive.  Musculoskeletal: no clubbing / cyanosis. No joint deformity upper and lower extremities. Good ROM, no contractures. Normal muscle tone.  Skin: no rashes, lesions, ulcers. No induration Neurologic: CN 2-12 grossly intact. Sensation intact, DTR normal. Strength 5/5 in all 4.  Psychiatric: Normal judgment and insight. Alert and oriented x 3. Normal mood.    Subsequent Medicare wellness visit   1. Risk factors, based on past  M,S,F -cardiovascular disease risk factors include age, gender, history of hypertension, history of hyperlipidemia   2.  Physical activities: Exercises routinely   3.  Depression/mood:  Stable, not depressed   4.  Hearing:  No perceived issues   5.  ADL's: Independent in all ADLs   6.  Fall risk:  Low fall risk   7.  Home safety: No problems identified   8.  Height weight, and visual acuity: height and weight as above, vision:   Visual Acuity Screening   Right eye Left eye Both eyes  Without correction: 20/20 20/20 20/20   With correction:        9.  Counseling:   Advised he update his shingles vaccine   10. Lab orders based on risk factors: Laboratory update will be reviewed   11. Referral :  None today   12. Care plan:  Follow-up with me in 6 months   13. Cognitive assessment:  No cognitive impairment   14. Screening: Patient provided with a written and personalized 5-10 year screening schedule in the AVS.   yes   15. Provider List Update:   PCP only  16. Advance Directives: Full code   17. Opioids: Patient is not on any opioid prescriptions and has no risk factors for a substance use disorder.   Flowsheet Row Office Visit from 03/12/2021 in [facility] [place]  PHQ-9 Total Score 0      Fall Risk  03/12/2021 12/13/2019  Falls in the past year? 0 0  Number falls in past yr: 0 0  Injury with Fall? 0 0     Impression and Plan:  Encounter for preventive health examination -He has routine eye and dental care. -He is due for his second Shingrix, otherwise immunizations are up-to-date and age-appropriate. -Screening labs today. -Healthy lifestyle discussed in detail. -He had a  colonoscopy in 2021 and is a 5-year callback.  CKD (chronic kidney disease) stage 2, GFR 60-89 ml/min -Baseline creatinine is one 1.080.  Primary hypertension  - Plan: CBC with Differential/Platelet, Comprehensive metabolic panel, Hemoglobin A1c -Blood pressure is finally well controlled on amlodipine 10 mg daily and lisinopril/hydrochlorothiazide daily. -Continue ambulatory measurements.  Hyperlipidemia, unspecified hyperlipidemia type  - Plan: Lipid panel -He is on pravastatin 40 mg daily. -Last cholesterol panel in May 2021 with total cholesterol of 176, LDL 104 and triglycerides 125.   Patient Instructions   -Nice seeing you today!!  -Lab work today; will notify you once results are available.  -Schedule follow up in 6 months or sooner if needed.   Preventive Care 62 Years and Older, Male Preventive care refers to lifestyle  choices and visits with your health care provider that can promote health and wellness. This includes:  A yearly physical exam. This is also called an annual wellness visit.  Regular dental and eye exams.  Immunizations.  Screening for certain conditions.  Healthy lifestyle choices, such as: ? Eating a healthy diet. ? Getting regular exercise. ? Not using drugs or products that contain nicotine and tobacco. ? Limiting alcohol use. What can I expect for my preventive care visit? Physical exam Your health care provider will check your:  Height and weight. These may be used to calculate your BMI (body mass index). BMI is a measurement that tells if you are at a healthy weight.  Heart rate and blood pressure.  Body temperature.  Skin for abnormal spots. Counseling Your health care provider may ask you questions about your:  Past medical problems.  Family's medical history.  Alcohol, tobacco, and drug use.  Emotional well-being.  Home life and relationship well-being.  Sexual activity.  Diet, exercise, and sleep habits.  History of falls.  Memory and ability to understand (cognition).  Work and work Statistician.  Access to firearms. What immunizations do I need? Vaccines are usually given at various ages, according to a schedule. Your health care provider will recommend vaccines for you based on your age, medical history, and lifestyle or other factors, such as travel or where you work.   What tests do I need? Blood tests  Lipid and cholesterol levels. These may be checked every 5 years, or more often depending on your overall health.  Hepatitis C test.  Hepatitis B test. Screening  Lung cancer screening. You may have this screening every year starting at age 72 if you have a 30-pack-year history of smoking and currently smoke or have quit within the past 15 years.  Colorectal cancer screening. ? All adults should have this screening starting at age 66 and  continuing until age 10. ? Your health care provider may recommend screening at age 12 if you are at increased risk. ? You will have tests every 1-10 years, depending on your results and the type of screening test.  Prostate cancer screening. Recommendations will vary depending on your family history and other risks.  Genital exam to check for testicular cancer or hernias.  Diabetes screening. ? This is done by checking your blood sugar (glucose) after you have not eaten for a while (fasting). ? You may have this done every 1-3 years.  Abdominal aortic aneurysm (AAA) screening. You may need this if you are a current or former smoker.  STD (sexually transmitted disease) testing, if you are at risk. Follow these instructions at home: Eating and drinking  Eat a diet that includes fresh  fruits and vegetables, whole grains, lean protein, and low-fat dairy products. Limit your intake of foods with high amounts of sugar, saturated fats, and salt.  Take vitamin and mineral supplements as recommended by your health care provider.  Do not drink alcohol if your health care provider tells you not to drink.  If you drink alcohol: ? Limit how much you have to 0-2 drinks a day. ? Be aware of how much alcohol is in your drink. In the [place] one drink equals one 12 oz bottle of beer (355 mL), one 5 oz glass of wine (148 mL), or one 1 oz glass of hard liquor (44 mL).   Lifestyle  Take daily care of your teeth and gums. Brush your teeth every morning and night with fluoride toothpaste. Floss one time each day.  Stay active. Exercise for at least 30 minutes 5 or more days each week.  Do not use any products that contain nicotine or tobacco, such as cigarettes, e-cigarettes, and chewing tobacco. If you need help quitting, ask your health care provider.  Do not use drugs.  If you are sexually active, practice safe sex. Use a condom or other form of protection to prevent STIs (sexually transmitted  infections).  Talk with your health care provider about taking a low-dose aspirin or statin.  Find healthy ways to cope with stress, such as: ? Meditation, yoga, or listening to music. ? Journaling. ? Talking to a trusted person. ? Spending time with friends and family. Safety  Always wear your seat belt while driving or riding in a vehicle.  Do not drive: ? If you have been drinking alcohol. Do not ride with someone who has been drinking. ? When you are tired or distracted. ? While texting.  Wear a helmet and other protective equipment during sports activities.  If you have firearms in your house, make sure you follow all gun safety procedures. What's next?  Visit your health care provider once a year for an annual wellness visit.  Ask your health care provider how often you should have your eyes and teeth checked.  Stay up to date on all vaccines. This information is not intended to replace advice given to you by your health care provider. Make sure you discuss any questions you have with your health care provider. Document Revised: 07/17/2019 Document Reviewed: 10/12/2018 Elsevier Patient Education  2021 [place], MD [facility]

## 2021-03-12 NOTE — Patient Instructions (Signed)
-Nice seeing you today!!  -Lab work today; will notify you once results are available.  -Schedule follow up in 6 months or sooner if needed.   Preventive Care 68 Years and Older, Male Preventive care refers to lifestyle choices and visits with your health care provider that can promote health and wellness. This includes:  A yearly physical exam. This is also called an annual wellness visit.  Regular dental and eye exams.  Immunizations.  Screening for certain conditions.  Healthy lifestyle choices, such as: ? Eating a healthy diet. ? Getting regular exercise. ? Not using drugs or products that contain nicotine and tobacco. ? Limiting alcohol use. What can I expect for my preventive care visit? Physical exam Your health care provider will check your:  Height and weight. These may be used to calculate your BMI (body mass index). BMI is a measurement that tells if you are at a healthy weight.  Heart rate and blood pressure.  Body temperature.  Skin for abnormal spots. Counseling Your health care provider may ask you questions about your:  Past medical problems.  Family's medical history.  Alcohol, tobacco, and drug use.  Emotional well-being.  Home life and relationship well-being.  Sexual activity.  Diet, exercise, and sleep habits.  History of falls.  Memory and ability to understand (cognition).  Work and work Statistician.  Access to firearms. What immunizations do I need? Vaccines are usually given at various ages, according to a schedule. Your health care provider will recommend vaccines for you based on your age, medical history, and lifestyle or other factors, such as travel or where you work.   What tests do I need? Blood tests  Lipid and cholesterol levels. These may be checked every 5 years, or more often depending on your overall health.  Hepatitis C test.  Hepatitis B test. Screening  Lung cancer screening. You may have this screening  every year starting at age 68 if you have a 30-pack-year history of smoking and currently smoke or have quit within the past 15 years.  Colorectal cancer screening. ? All adults should have this screening starting at age 68 and continuing until age 68. ? Your health care provider may recommend screening at age 68 if you are at increased risk. ? You will have tests every 1-10 years, depending on your results and the type of screening test.  Prostate cancer screening. Recommendations will vary depending on your family history and other risks.  Genital exam to check for testicular cancer or hernias.  Diabetes screening. ? This is done by checking your blood sugar (glucose) after you have not eaten for a while (fasting). ? You may have this done every 1-3 years.  Abdominal aortic aneurysm (AAA) screening. You may need this if you are a current or former smoker.  STD (sexually transmitted disease) testing, if you are at risk. Follow these instructions at home: Eating and drinking  Eat a diet that includes fresh fruits and vegetables, whole grains, lean protein, and low-fat dairy products. Limit your intake of foods with high amounts of sugar, saturated fats, and salt.  Take vitamin and mineral supplements as recommended by your health care provider.  Do not drink alcohol if your health care provider tells you not to drink.  If you drink alcohol: ? Limit how much you have to 0-2 drinks a day. ? Be aware of how much alcohol is in your drink. In the [place] one drink equals one 12 oz bottle of beer (355 mL),  one 5 oz glass of wine (148 mL), or one 1 oz glass of hard liquor (44 mL).   Lifestyle  Take daily care of your teeth and gums. Brush your teeth every morning and night with fluoride toothpaste. Floss one time each day.  Stay active. Exercise for at least 30 minutes 5 or more days each week.  Do not use any products that contain nicotine or tobacco, such as cigarettes, e-cigarettes,  and chewing tobacco. If you need help quitting, ask your health care provider.  Do not use drugs.  If you are sexually active, practice safe sex. Use a condom or other form of protection to prevent STIs (sexually transmitted infections).  Talk with your health care provider about taking a low-dose aspirin or statin.  Find healthy ways to cope with stress, such as: ? Meditation, yoga, or listening to music. ? Journaling. ? Talking to a trusted person. ? Spending time with friends and family. Safety  Always wear your seat belt while driving or riding in a vehicle.  Do not drive: ? If you have been drinking alcohol. Do not ride with someone who has been drinking. ? When you are tired or distracted. ? While texting.  Wear a helmet and other protective equipment during sports activities.  If you have firearms in your house, make sure you follow all gun safety procedures. What's next?  Visit your health care provider once a year for an annual wellness visit.  Ask your health care provider how often you should have your eyes and teeth checked.  Stay up to date on all vaccines. This information is not intended to replace advice given to you by your health care provider. Make sure you discuss any questions you have with your health care provider. Document Revised: 07/17/2019 Document Reviewed: 10/12/2018 Elsevier Patient Education  2021 [employer].

## 2021-03-13 LAB — COMPREHENSIVE METABOLIC PANEL
ALT: 19 U/L (ref 0–53)
AST: 20 U/L (ref 0–37)
Albumin: 4.3 g/dL (ref 3.5–5.2)
Alkaline Phosphatase: 65 U/L (ref 39–117)
BUN: 20 mg/dL (ref 6–23)
CO2: 26 mEq/L (ref 19–32)
Calcium: 9.3 mg/dL (ref 8.4–10.5)
Chloride: 98 mEq/L (ref 96–112)
Creatinine, Ser: 0.94 mg/dL (ref 0.40–1.50)
GFR: 83.88 mL/min (ref >60.00)
Glucose, Bld: 88 mg/dL (ref 70–99)
Potassium: 4.1 mEq/L (ref 3.5–5.1)
Sodium: 132 mEq/L — ABNORMAL LOW (ref 135–145)
Total Bilirubin: 0.6 mg/dL (ref 0.2–1.2)
Total Protein: 7.4 g/dL (ref 6.0–8.3)

## 2021-03-13 LAB — LIPID PANEL
Cholesterol: 144 mg/dL (ref 0–200)
HDL: 44.5 mg/dL (ref >39.00)
LDL Cholesterol: 78 mg/dL (ref 0–99)
NonHDL: 99.8
Total CHOL/HDL Ratio: 3
Triglycerides: 111 mg/dL (ref 0.0–149.0)
VLDL: 22.2 mg/dL (ref 0.0–40.0)

## 2021-03-28 ENCOUNTER — Other Ambulatory Visit: Payer: Self-pay | Admitting: Internal Medicine

## 2021-03-28 DIAGNOSIS — I10 Essential (primary) hypertension: Secondary | ICD-10-CM

## 2021-04-13 ENCOUNTER — Encounter: Payer: Self-pay | Admitting: Internal Medicine

## 2021-05-02 ENCOUNTER — Other Ambulatory Visit: Payer: Self-pay | Admitting: Internal Medicine

## 2021-05-02 DIAGNOSIS — I10 Essential (primary) hypertension: Secondary | ICD-10-CM

## 2021-05-14 ENCOUNTER — Other Ambulatory Visit: Payer: Self-pay | Admitting: Internal Medicine

## 2021-05-14 DIAGNOSIS — I10 Essential (primary) hypertension: Secondary | ICD-10-CM

## 2021-08-13 ENCOUNTER — Encounter: Payer: Self-pay | Admitting: Internal Medicine

## 2021-08-13 DIAGNOSIS — E785 Hyperlipidemia, unspecified: Secondary | ICD-10-CM

## 2021-08-14 ENCOUNTER — Other Ambulatory Visit: Payer: Self-pay | Admitting: Internal Medicine

## 2021-08-14 ENCOUNTER — Encounter: Payer: Self-pay | Admitting: Internal Medicine

## 2021-08-14 DIAGNOSIS — E785 Hyperlipidemia, unspecified: Secondary | ICD-10-CM

## 2021-08-18 MED ORDER — PRAVASTATIN SODIUM 40 MG PO TABS: 40.0000 mg | ORAL_TABLET | Freq: Every day | ORAL | 1 refills | Status: DC

## 2021-09-02 ENCOUNTER — Encounter: Payer: Self-pay | Admitting: Internal Medicine

## 2021-09-02 ENCOUNTER — Other Ambulatory Visit: Payer: Self-pay

## 2021-09-03 ENCOUNTER — Encounter: Payer: Self-pay | Admitting: Internal Medicine

## 2021-09-03 ENCOUNTER — Other Ambulatory Visit: Payer: Self-pay

## 2021-09-03 ENCOUNTER — Ambulatory Visit (INDEPENDENT_AMBULATORY_CARE_PROVIDER_SITE_OTHER): Payer: Medicare Other | Admitting: Internal Medicine

## 2021-09-03 VITALS — BP 130/80 | HR 94 | Temp 98.0°F | Wt 175.2 lb

## 2021-09-03 DIAGNOSIS — E785 Hyperlipidemia, unspecified: Secondary | ICD-10-CM

## 2021-09-03 DIAGNOSIS — I10 Essential (primary) hypertension: Secondary | ICD-10-CM

## 2021-09-03 DIAGNOSIS — N182 Chronic kidney disease, stage 2 (mild): Secondary | ICD-10-CM

## 2021-09-03 NOTE — Progress Notes (Signed)
Established Patient Office Visit     This visit occurred during the SARS-CoV-2 public health emergency.  Safety protocols were in place, including screening questions prior to the visit, additional usage of staff PPE, and extensive cleaning of exam room while observing appropriate contact time as indicated for disinfecting solutions.    CC/Reason for Visit: 69-month follow-up chronic medical conditions  HPI: Dustin Hodges is a 70 y.o. male who is coming in today for the above mentioned reasons. Past Medical History is significant for: Hypertension, hyperlipidemia and chronic kidney disease stage II.  He has been doing well and has no acute concerns.  He has had his COVID booster and flu vaccine, he also completed his shingles vaccinations this year.   Past Medical/Surgical History: Past Medical History:  Diagnosis Date   DDD (degenerative disc disease), lumbar    GERD (gastroesophageal reflux disease)    possibly and untreated    Hyperlipidemia    Hypertension     Past Surgical History:  Procedure Laterality Date   COLONOSCOPY  last 2015-2016   last colon in [place] 2015- normal    KNEE ARTHROSCOPY     meniscus tear    POLYPECTOMY  2010   benign - pt unsure of lb path on polyp     Social History:  reports that he has never smoked. He has never used smokeless tobacco. He reports current alcohol use. He reports that he does not use drugs.  Allergies: No Known Allergies  Family History:  Family History  Problem Relation Age of Onset   CVA Mother    Heart failure Father    Colon cancer Neg Hx    Colon polyps Neg Hx    Esophageal cancer Neg Hx    Rectal cancer Neg Hx    Stomach cancer Neg Hx      Current Outpatient Medications:    amLODipine (NORVASC) 10 MG tablet, TAKE 1 TABLET(10 MG) BY MOUTH DAILY, Disp: 90 tablet, Rfl: 1   amLODipine (NORVASC) 5 MG tablet, TAKE 1 TABLET(5 MG) BY MOUTH DAILY, Disp: 90 tablet, Rfl: 1   aspirin EC 81 MG tablet, Take 81  mg by mouth daily., Disp: , Rfl:    Cholecalciferol (VITAMIN D) 50 MCG (2000 UT) tablet, Take 2,000 Units by mouth daily., Disp: , Rfl:    Clobetasol Prop Emollient Base (CLOBETASOL PROPIONATE E) 0.05 % emollient cream, Apply 1 application topically 2 (two) times daily. Onto scalp, Disp: 30 g, Rfl: 2   lisinopril-hydrochlorothiazide (ZESTORETIC) 20-12.5 MG tablet, TAKE 1 TABLET BY MOUTH TWICE DAILY, Disp: 180 tablet, Rfl: 1   pravastatin (PRAVACHOL) 40 MG tablet, Take 1 tablet (40 mg total) by mouth at bedtime., Disp: 90 tablet, Rfl: 1   triamcinolone cream (KENALOG) 0.1 %, Apply 1 application topically 2 (two) times daily., Disp: 30 g, Rfl: 1  Review of Systems:  Constitutional: Denies fever, chills, diaphoresis, appetite change and fatigue.  HEENT: Denies photophobia, eye pain, redness, hearing loss, ear pain, congestion, sore throat, rhinorrhea, sneezing, mouth sores, trouble swallowing, neck pain, neck stiffness and tinnitus.   Respiratory: Denies SOB, DOE, cough, chest tightness,  and wheezing.   Cardiovascular: Denies chest pain, palpitations and leg swelling.  Gastrointestinal: Denies nausea, vomiting, abdominal pain, diarrhea, constipation, blood in stool and abdominal distention.  Genitourinary: Denies dysuria, urgency, frequency, hematuria, flank pain and difficulty urinating.  Endocrine: Denies: hot or cold intolerance, sweats, changes in hair or nails, polyuria, polydipsia. Musculoskeletal: Denies myalgias, back pain, joint swelling, arthralgias  and gait problem.  Skin: Denies pallor, rash and wound.  Neurological: Denies dizziness, seizures, syncope, weakness, light-headedness, numbness and headaches.  Hematological: Denies adenopathy. Easy bruising, personal or family bleeding history  Psychiatric/Behavioral: Denies suicidal ideation, mood changes, confusion, nervousness, sleep disturbance and agitation    Physical Exam: Vitals:   09/03/21 0953  BP: 130/80  Pulse: 94  Temp:  98 F (36.7 C)  TempSrc: Oral  SpO2: 98%  Weight: 175 lb 3.2 oz (79.5 kg)    Body mass index is 26.64 kg/m.   Constitutional: NAD, calm, comfortable Eyes: PERRL, lids and conjunctivae normal ENMT: Mucous membranes are moist. ng. Neck: normal, supple, no masses, no thyromegaly Respiratory: clear to auscultation bilaterally, no wheezing, no crackles. Normal respiratory effort. No accessory muscle use.  Cardiovascular: Regular rate and rhythm, no murmurs / rubs / gallops. No extremity edema.  Neurologic: Grossly intact and nonfocal Psychiatric: Normal judgment and insight. Alert and oriented x 3. Normal mood.    Impression and Plan:  Primary hypertension -Fair control on amlodipine and lisinopril/hydrochlorothiazide. -In office measurement is 130/80, however home measurements are usually around 120/70.  Hyperlipidemia, unspecified hyperlipidemia type -Last lipid panel in May 2022 with a total cholesterol of 144, triglycerides 111 and LDL 78. -Continue pravastatin 40 mg daily.  CKD (chronic kidney disease) stage 2, GFR 60-89 ml/min -Last creatinine was 0.940 in May 2022.  Time spent: 32 minutes reviewing chart, interviewing and examining patient and formulating plan of care.     Lelon Frohlich, MD [facility] at [facility]

## 2021-09-29 ENCOUNTER — Other Ambulatory Visit: Payer: Self-pay | Admitting: Internal Medicine

## 2021-09-29 DIAGNOSIS — I10 Essential (primary) hypertension: Secondary | ICD-10-CM

## 2021-09-30 ENCOUNTER — Encounter: Payer: Self-pay | Admitting: Internal Medicine

## 2021-10-25 ENCOUNTER — Other Ambulatory Visit: Payer: Self-pay | Admitting: Internal Medicine

## 2021-10-25 DIAGNOSIS — I10 Essential (primary) hypertension: Secondary | ICD-10-CM

## 2021-11-06 ENCOUNTER — Other Ambulatory Visit: Payer: Self-pay | Admitting: Internal Medicine

## 2021-11-06 DIAGNOSIS — I10 Essential (primary) hypertension: Secondary | ICD-10-CM

## 2021-11-17 ENCOUNTER — Other Ambulatory Visit: Payer: Self-pay

## 2021-11-17 ENCOUNTER — Ambulatory Visit: Payer: Medicare Other | Admitting: Dermatology

## 2021-11-17 ENCOUNTER — Encounter: Payer: Self-pay | Admitting: Dermatology

## 2021-11-17 DIAGNOSIS — Z1283 Encounter for screening for malignant neoplasm of skin: Secondary | ICD-10-CM

## 2021-11-17 DIAGNOSIS — L570 Actinic keratosis: Secondary | ICD-10-CM

## 2021-11-17 DIAGNOSIS — L309 Dermatitis, unspecified: Secondary | ICD-10-CM | POA: Diagnosis not present

## 2021-11-17 NOTE — Patient Instructions (Addendum)
Pick up over the counter pramoxine , cerave anti itch lotion

## 2021-12-10 ENCOUNTER — Encounter: Payer: Self-pay | Admitting: Dermatology

## 2021-12-10 NOTE — Progress Notes (Signed)
° °  Follow-Up Visit   Subjective  Dustin Hodges is a 71 y.o. male who presents for the following: Annual Exam (Lesion on left wrist x 3 months, lesion on right hand x 3 months. Front neck irritation x 3 months.  ).  Normal skin examination, several places of concern. Location:  Duration:  Quality:  Associated Signs/Symptoms: Modifying Factors:  Severity:  Timing: Context:   Objective  Well appearing patient in no apparent distress; mood and affect are within normal limits. Scalp  General skin examination: No atypical pigmented lesions.  No nonmelanoma skin cancer.   Keratosis on chest, back ,right hand and scalp. Tags on underarms. Angiomas on torso     Anterior Mid Neck Itching without visible  inflammation.  Over the counter pramoxine , cerave anti itch lotion   Right Hand - Posterior, Scalp (2) Hornlike 3 mm pink crust    A full examination was performed including scalp, head, eyes, ears, nose, lips, neck, chest, axillae, abdomen, back, buttocks, bilateral upper extremities, bilateral lower extremities, hands, feet, fingers, toes, fingernails, and toenails. All findings within normal limits unless otherwise noted below.  Areas beneath undergarments not fully examined.   Assessment & Plan    Encounter for screening for malignant neoplasm of skin Scalp  Annual skin examination, encouraged to self examine twice annually.  Continued ultraviolet protection.  Eczema, unspecified type Anterior Mid Neck  To contact our office if there is no improvement in 3 weeks.  Related Medications Clobetasol Prop Emollient Base (CLOBETASOL PROPIONATE E) 0.05 % emollient cream Apply 1 application topically 2 (two) times daily. Onto scalp  AK (actinic keratosis) (3) Right Hand - Posterior; Scalp (2)  Destruction of lesion - Right Hand - Posterior, Scalp Complexity: simple   Destruction method: cryotherapy   Informed consent: discussed and consent obtained    Timeout:  patient name, date of birth, surgical site, and procedure verified Lesion destroyed using liquid nitrogen: Yes   Cryotherapy cycles:  3 Outcome: patient tolerated procedure well with no complications   Post-procedure details: wound care instructions given        I, Lavonna Monarch, MD, have reviewed all documentation for this visit.  The documentation on 12/10/21 for the exam, diagnosis, procedures, and orders are all accurate and complete.

## 2021-12-23 ENCOUNTER — Encounter: Payer: Self-pay | Admitting: Family Medicine

## 2021-12-23 ENCOUNTER — Telehealth (INDEPENDENT_AMBULATORY_CARE_PROVIDER_SITE_OTHER): Payer: Medicare Other | Admitting: Family Medicine

## 2021-12-23 ENCOUNTER — Telehealth: Payer: Self-pay | Admitting: Internal Medicine

## 2021-12-23 ENCOUNTER — Encounter: Payer: Self-pay | Admitting: Internal Medicine

## 2021-12-23 VITALS — Ht 68.0 in

## 2021-12-23 DIAGNOSIS — U071 COVID-19: Secondary | ICD-10-CM

## 2021-12-23 MED ORDER — NIRMATRELVIR/RITONAVIR (PAXLOVID)TABLET
3.0000 | ORAL_TABLET | Freq: Two times a day (BID) | ORAL | 0 refills | Status: AC
Start: 2021-12-23 — End: 2021-12-28

## 2021-12-23 NOTE — Telephone Encounter (Signed)
Patient calling in with respiratory symptoms: Shortness of breath, chest pain, palpitations or other red words send to Triage  Does the patient have a fever over 100, cough, congestion, sore throat, runny nose, lost of taste/smell (please list symptoms that patient has)?congestion   What date did symptoms start?12/22/21 at 6 pm  (If over 5 days ago, pt may be scheduled for in person visit)  Have you tested for Covid in the last 5 days? Yes   If yes, was it positive [x]  OR negative [] ? If positive in the last 5 days, please schedule virtual visit now. If negative, schedule for an in person OV with the next available provider if PCP has no openings. Please also let patient know they will be tested again (follow the script below)  "you will have to arrive 56mins prior to your appt time to be Covid tested. Please park in back of office at the cone & call [phone] to let the staff know you have arrived. A staff member will meet you at your car to do a rapid covid test. Once the test has resulted you will be notified by phone of your results to determine if appt will remain an in person visit or be converted to a virtual/phone visit. If you arrive less than 29mins before your appt time, your visit will be automatically converted to virtual & any recommended testing will happen AFTER the visit." Virtual appt today with dr. Martinique at 4:00  Dustin Hodges  If no availability for virtual visit in office,  please schedule another [place] office  If no availability at another [place] office, please instruct patient that they can schedule an evisit or virtual visit through their mychart account. Visits up to 8pm  patients can be seen in office 5 days after positive COVID test

## 2021-12-23 NOTE — Progress Notes (Signed)
Virtual Visit via Video Note I connected with Dustin Hodges on 12/23/21 by a video enabled telemedicine application and verified that I am speaking with the correct person using two identifiers.  Location patient: home Location provider:work office Persons participating in the virtual visit: patient, provider  I discussed the limitations of evaluation and management by telemedicine and the availability of in person appointments. The patient expressed understanding and agreed to proceed.  Chief Complaint  Patient presents with   Covid Positive   HPI: Mr. Teichert is a 71 yo male with history of hypertension, hyperlipidemia, and GERD complaining of a day of respiratory symptoms. Fatigue, nasal congestion, rhinorrhea, postnasal drainage, and body aches. He has not noted fever, chills, anosmia,ageusia,sore throat, CP, cough, wheezing, difficulty breathing, abdominal pain, nausea, vomiting, changes in bowel habits, urinary symptoms, or a skin rash. Taking Mucinex.  No known sick contact. COVID-19 vaccination completed. He performed a COVID-19 test at home this morning and it was positive.  He has had COVID-19 infection in the past, he took Paxlovid and tolerated well.  ROS: See pertinent positives and negatives per HPI.  Past Medical History:  Diagnosis Date   DDD (degenerative disc disease), lumbar    GERD (gastroesophageal reflux disease)    possibly and untreated    Hyperlipidemia    Hypertension     Past Surgical History:  Procedure Laterality Date   COLONOSCOPY  last 2015-2016   last colon in [place] 2015- normal    KNEE ARTHROSCOPY     meniscus tear    POLYPECTOMY  2010   benign - pt unsure of lb path on polyp    Family History  Problem Relation Age of Onset   CVA Mother    Heart failure Father    Colon cancer Neg Hx    Colon polyps Neg Hx    Esophageal cancer Neg Hx    Rectal cancer Neg Hx    Stomach cancer Neg Hx     Social History   Socioeconomic  History   Marital status: Married    Spouse name: Not on file   Number of children: Not on file   Years of education: Not on file   Highest education level: Not on file  Occupational History   Not on file  Tobacco Use   Smoking status: Never   Smokeless tobacco: Never  Vaping Use   Vaping Use: Never used  Substance and Sexual Activity   Alcohol use: Yes    Comment: occasional   Drug use: Never   Sexual activity: Not on file  Other Topics Concern   Not on file  Social History Narrative   Not on file   Social Determinants of Health   Financial Resource Strain: Not on file  Food Insecurity: Not on file  Transportation Needs: Not on file  Physical Activity: Not on file  Stress: Not on file  Social Connections: Not on file  Intimate Partner Violence: Not on file   Current Outpatient Medications:    amLODipine (NORVASC) 10 MG tablet, TAKE 1 TABLET(10 MG) BY MOUTH DAILY, Disp: 90 tablet, Rfl: 1   aspirin EC 81 MG tablet, Take 81 mg by mouth daily., Disp: , Rfl:    Cholecalciferol (VITAMIN D) 50 MCG (2000 UT) tablet, Take 2,000 Units by mouth daily., Disp: , Rfl:    Clobetasol Prop Emollient Base (CLOBETASOL PROPIONATE E) 0.05 % emollient cream, Apply 1 application topically 2 (two) times daily. Onto scalp, Disp: 30 g, Rfl: 2  lisinopril-hydrochlorothiazide (ZESTORETIC) 20-12.5 MG tablet, TAKE 1 TABLET BY MOUTH TWICE DAILY, Disp: 180 tablet, Rfl: 1   pravastatin (PRAVACHOL) 40 MG tablet, Take 1 tablet (40 mg total) by mouth at bedtime., Disp: 90 tablet, Rfl: 1   triamcinolone cream (KENALOG) 0.1 %, Apply 1 application topically 2 (two) times daily., Disp: 30 g, Rfl: 1  EXAM:  VITALS per patient if applicable:Ht 5\' 8"  (1.727 m)    BMI 26.64 kg/m   GENERAL: alert, oriented, appears well and in no acute distress  HEENT: atraumatic, conjunctiva clear, no obvious abnormalities on inspection of external nose and ears  NECK: normal movements of the head and neck  LUNGS: on  inspection no signs of respiratory distress, breathing rate appears normal, no obvious gross SOB, gasping or wheezing  CV: no obvious cyanosis  MS: moves all visible extremities without noticeable abnormality  PSYCH/NEURO: pleasant and cooperative, no obvious depression or anxiety, speech and thought processing grossly intact  ASSESSMENT AND PLAN:  Discussed the following assessment and plan:  COVID-19 virus infection - Plan: nirmatrelvir/ritonavir EUA (PAXLOVID) 20 x 150 MG & 10 x 100MG  TABS We discussed Dx,possible complications and treatment options. He has a mild case with risk for complications. We discussed oral antiviral options and side effects. He has taken Paxlovid before, well tolerated, and no hx of CKD or liver abnormalities; so Rx sent. He was instructed to hold on statin med while taking antiviral medication. Symptomatic treatment with plenty of fluids,rest,tylenol 500 mg 3-4 times per day prn. Monitor for new symptoms.. Throat lozenges if he develops sore throat. 5 to 7 days of quarantine recommended. Clearly instructed about warning signs.  We discussed possible serious and likely etiologies, options for evaluation and workup, limitations of telemedicine visit vs in person visit, treatment, treatment risks and precautions. The patient was advised to call back or seek an in-person evaluation if the symptoms worsen or if the condition fails to improve as anticipated. I discussed the assessment and treatment plan with the patient. The patient was provided an opportunity to ask questions and all were answered. The patient agreed with the plan and demonstrated an understanding of the instructions.  Return if symptoms worsen or fail to improve.  Coalton Arch G. Martinique, MD  [facility]. [place] office.

## 2021-12-30 ENCOUNTER — Encounter: Payer: Self-pay | Admitting: Internal Medicine

## 2021-12-30 DIAGNOSIS — M5136 Other intervertebral disc degeneration, lumbar region: Secondary | ICD-10-CM

## 2022-01-06 IMAGING — DX DG CHEST 1V
1 series · 1 of 1 positions shown · non-contrast
Comparison: None.

CLINICAL DATA: Dizziness and lightheadedness beginning this
morning.

EXAM:
CHEST  1 VIEW

[chest ap]
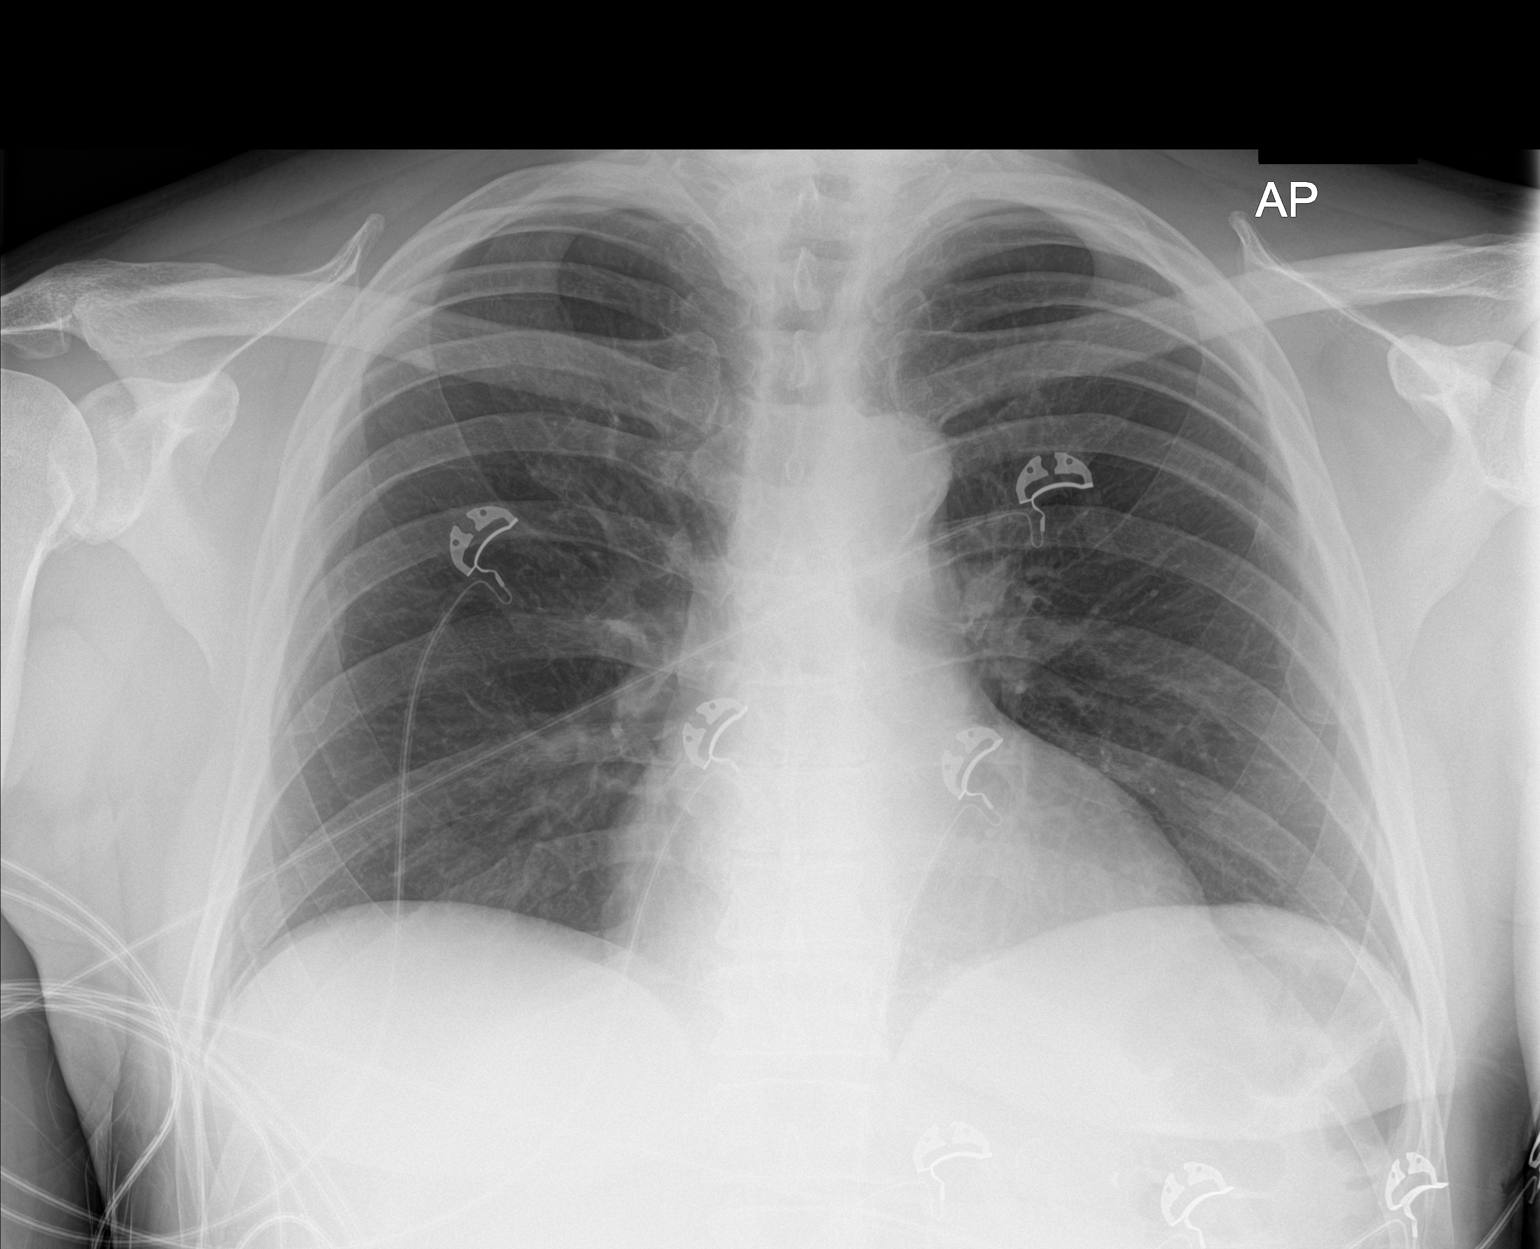

[1 of 1 positions shown; findings below may reference images not displayed]

FINDINGS: Heart, mediastinum and hila are unremarkable.

Clear lungs.  No pleural effusion or pneumothorax.

Skeletal structures are grossly intact.
IMPRESSION: No active disease.

## 2022-01-07 DIAGNOSIS — M546 Pain in thoracic spine: Secondary | ICD-10-CM | POA: Diagnosis not present

## 2022-01-07 DIAGNOSIS — M5451 Vertebrogenic low back pain: Secondary | ICD-10-CM | POA: Diagnosis not present

## 2022-02-09 ENCOUNTER — Other Ambulatory Visit: Payer: Self-pay | Admitting: Internal Medicine

## 2022-02-09 DIAGNOSIS — E785 Hyperlipidemia, unspecified: Secondary | ICD-10-CM

## 2022-02-10 ENCOUNTER — Encounter: Payer: Self-pay | Admitting: Internal Medicine

## 2022-02-10 DIAGNOSIS — E785 Hyperlipidemia, unspecified: Secondary | ICD-10-CM

## 2022-02-10 MED ORDER — PRAVASTATIN SODIUM 40 MG PO TABS: ORAL_TABLET | ORAL | 1 refills | Status: DC

## 2022-02-11 ENCOUNTER — Other Ambulatory Visit: Payer: Self-pay | Admitting: Internal Medicine

## 2022-02-11 DIAGNOSIS — E785 Hyperlipidemia, unspecified: Secondary | ICD-10-CM

## 2022-03-02 DIAGNOSIS — H524 Presbyopia: Secondary | ICD-10-CM | POA: Diagnosis not present

## 2022-03-02 DIAGNOSIS — H2513 Age-related nuclear cataract, bilateral: Secondary | ICD-10-CM | POA: Diagnosis not present

## 2022-03-09 ENCOUNTER — Encounter: Payer: Self-pay | Admitting: Internal Medicine

## 2022-03-09 ENCOUNTER — Ambulatory Visit (INDEPENDENT_AMBULATORY_CARE_PROVIDER_SITE_OTHER): Payer: Medicare Other | Admitting: Internal Medicine

## 2022-03-09 ENCOUNTER — Ambulatory Visit: Payer: Medicare Other

## 2022-03-09 VITALS — BP 110/62 | HR 78 | Temp 97.6°F | Ht 67.5 in | Wt 175.6 lb

## 2022-03-09 DIAGNOSIS — K219 Gastro-esophageal reflux disease without esophagitis: Secondary | ICD-10-CM

## 2022-03-09 DIAGNOSIS — K5900 Constipation, unspecified: Secondary | ICD-10-CM

## 2022-03-09 DIAGNOSIS — E785 Hyperlipidemia, unspecified: Secondary | ICD-10-CM | POA: Diagnosis not present

## 2022-03-09 DIAGNOSIS — N182 Chronic kidney disease, stage 2 (mild): Secondary | ICD-10-CM | POA: Diagnosis not present

## 2022-03-09 DIAGNOSIS — Z125 Encounter for screening for malignant neoplasm of prostate: Secondary | ICD-10-CM

## 2022-03-09 DIAGNOSIS — I10 Essential (primary) hypertension: Secondary | ICD-10-CM | POA: Diagnosis not present

## 2022-03-09 DIAGNOSIS — Z0000 Encounter for general adult medical examination without abnormal findings: Secondary | ICD-10-CM

## 2022-03-09 LAB — CBC WITH DIFFERENTIAL/PLATELET
Basophils Absolute: 0 10*3/uL (ref 0.0–0.1)
Basophils Relative: 0.7 % (ref 0.0–3.0)
Eosinophils Absolute: 0.2 10*3/uL (ref 0.0–0.7)
Eosinophils Relative: 3.2 % (ref 0.0–5.0)
HCT: 45 % (ref 39.0–52.0)
Hemoglobin: 15.4 g/dL (ref 13.0–17.0)
Lymphocytes Relative: 18.3 % (ref 12.0–46.0)
Lymphs Abs: 1.1 10*3/uL (ref 0.7–4.0)
MCHC: 34.2 g/dL (ref 30.0–36.0)
MCV: 88 fl (ref 78.0–100.0)
Monocytes Absolute: 0.7 10*3/uL (ref 0.1–1.0)
Monocytes Relative: 11.8 % (ref 3.0–12.0)
Neutro Abs: 4 10*3/uL (ref 1.4–7.7)
Neutrophils Relative %: 66 % (ref 43.0–77.0)
Platelets: 238 10*3/uL (ref 150.0–400.0)
RBC: 5.11 Mil/uL (ref 4.22–5.81)
RDW: 13.2 % (ref 11.5–15.5)
WBC: 6.1 10*3/uL (ref 4.0–10.5)

## 2022-03-09 LAB — LIPID PANEL
Cholesterol: 152 mg/dL (ref 0–200)
HDL: 44.9 mg/dL (ref >39.00)
LDL Cholesterol: 88 mg/dL (ref 0–99)
NonHDL: 106.72
Total CHOL/HDL Ratio: 3
Triglycerides: 92 mg/dL (ref 0.0–149.0)
VLDL: 18.4 mg/dL (ref 0.0–40.0)

## 2022-03-09 LAB — COMPREHENSIVE METABOLIC PANEL
ALT: 15 U/L (ref 0–53)
AST: 18 U/L (ref 0–37)
Albumin: 4.2 g/dL (ref 3.5–5.2)
Alkaline Phosphatase: 55 U/L (ref 39–117)
BUN: 26 mg/dL — ABNORMAL HIGH (ref 6–23)
CO2: 28 mEq/L (ref 19–32)
Calcium: 9.2 mg/dL (ref 8.4–10.5)
Chloride: 99 mEq/L (ref 96–112)
Creatinine, Ser: 1.1 mg/dL (ref 0.40–1.50)
GFR: 68.98 mL/min (ref >60.00)
Glucose, Bld: 98 mg/dL (ref 70–99)
Potassium: 4.3 mEq/L (ref 3.5–5.1)
Sodium: 134 mEq/L — ABNORMAL LOW (ref 135–145)
Total Bilirubin: 1 mg/dL (ref 0.2–1.2)
Total Protein: 7.1 g/dL (ref 6.0–8.3)

## 2022-03-09 LAB — HEMOGLOBIN A1C: Hgb A1c MFr Bld: 5.1 % (ref 4.6–6.5)

## 2022-03-09 LAB — PSA: PSA: 1.44 ng/mL (ref 0.10–4.00)

## 2022-03-09 MED ORDER — LISINOPRIL-HYDROCHLOROTHIAZIDE 20-12.5 MG PO TABS: 1.0000 | ORAL_TABLET | Freq: Two times a day (BID) | ORAL | 1 refills | Status: DC

## 2022-03-09 MED ORDER — PANTOPRAZOLE SODIUM 40 MG PO TBEC
40.0000 mg | DELAYED_RELEASE_TABLET | Freq: Every day | ORAL | 1 refills | Status: DC
Start: 2022-03-09 — End: 2022-09-06

## 2022-03-09 MED ORDER — AMLODIPINE BESYLATE 10 MG PO TABS: ORAL_TABLET | ORAL | 1 refills | Status: DC

## 2022-03-09 NOTE — Patient Instructions (Signed)
-  Nice seeing you today!! ? ?-Lab work today; will notify you once results are available. ? ?-Remember your tdap vaccine. ? ?-Start pantoprazole 40 mg daily. ? ?-Schedule follow up in 6 months or sooner as needed. ? ? ?

## 2022-03-09 NOTE — Progress Notes (Signed)
? ? ? ?Established Patient Office Visit ? ? ? ? ?CC/Reason for Visit: Annual preventive exam, subsequent Medicare wellness visit and discuss some acute concerns ? ?HPI: Dustin Hodges is a 71 y.o. male who is coming in today for the above mentioned reasons. Past Medical History is significant for: Hypertension, hyperlipidemia, chronic kidney disease stage II.  He has routine eye and dental care.  Only minimal hearing issues, he exercises by doing yoga and stretching as well as aerobic activity.  He had a colonoscopy in 2021.  He is due for Tdap, all other immunizations are up-to-date.  He has been complaining of a hoarse voice at times, his dentist has commented on wearing off enamel on his teeth sooner than anticipated, he has noticed some reflux at nighttime.  Taking Gaviscon provides resolution of symptoms.  He has been having some constipation. ? ? ?Past Medical/Surgical History: ?Past Medical History:  ?Diagnosis Date  ? DDD (degenerative disc disease), lumbar   ? GERD (gastroesophageal reflux disease)   ? possibly and untreated   ? Hyperlipidemia   ? Hypertension   ? ? ?Past Surgical History:  ?Procedure Laterality Date  ? COLONOSCOPY  last 2015-2016  ? last colon in [place] 2015- normal   ? KNEE ARTHROSCOPY    ? meniscus tear   ? POLYPECTOMY  2010  ? benign - pt unsure of lb path on polyp   ? ? ?Social History: ? reports that he has never smoked. He has never used smokeless tobacco. He reports current alcohol use. He reports that he does not use drugs. ? ?Allergies: ?No Known Allergies ? ?Family History:  ?Family History  ?Problem Relation Age of Onset  ? CVA Mother   ? Heart failure Father   ? Colon cancer Neg Hx   ? Colon polyps Neg Hx   ? Esophageal cancer Neg Hx   ? Rectal cancer Neg Hx   ? Stomach cancer Neg Hx   ? ? ? ?Current Outpatient Medications:  ?  aspirin EC 81 MG tablet, Take 81 mg by mouth daily., Disp: , Rfl:  ?  Cholecalciferol (VITAMIN D) 50 MCG (2000 UT) tablet, Take 2,000 Units by  mouth daily., Disp: , Rfl:  ?  Clobetasol Prop Emollient Base (CLOBETASOL PROPIONATE E) 0.05 % emollient cream, Apply 1 application topically 2 (two) times daily. Onto scalp, Disp: 30 g, Rfl: 2 ?  pantoprazole (PROTONIX) 40 MG tablet, Take 1 tablet (40 mg total) by mouth daily., Disp: 90 tablet, Rfl: 1 ?  pravastatin (PRAVACHOL) 40 MG tablet, TAKE 1 TABLET(40 MG) BY MOUTH AT BEDTIME, Disp: 90 tablet, Rfl: 1 ?  triamcinolone cream (KENALOG) 0.1 %, Apply 1 application topically 2 (two) times daily., Disp: 30 g, Rfl: 1 ?  amLODipine (NORVASC) 10 MG tablet, TAKE 1 TABLET(10 MG) BY MOUTH DAILY, Disp: 90 tablet, Rfl: 1 ?  lisinopril-hydrochlorothiazide (ZESTORETIC) 20-12.5 MG tablet, Take 1 tablet by mouth 2 (two) times daily., Disp: 180 tablet, Rfl: 1 ? ?Review of Systems:  ?Constitutional: Denies fever, chills, diaphoresis, appetite change and fatigue.  ?HEENT: Denies photophobia, eye pain, redness, hearing loss, ear pain, congestion, sore throat, rhinorrhea, sneezing, mouth sores, trouble swallowing, neck pain, neck stiffness and tinnitus.   ?Respiratory: Denies SOB, DOE, cough, chest tightness,  and wheezing.   ?Cardiovascular: Denies chest pain, palpitations and leg swelling.  ?Gastrointestinal: Denies nausea, vomiting, abdominal pain, diarrhea,  blood in stool and abdominal distention.  ?Genitourinary: Denies dysuria, urgency, frequency, hematuria, flank pain and difficulty urinating.  ?  Endocrine: Denies: hot or cold intolerance, sweats, changes in hair or nails, polyuria, polydipsia. ?Musculoskeletal: Denies myalgias, back pain, joint swelling, arthralgias and gait problem.  ?Skin: Denies pallor, rash and wound.  ?Neurological: Denies dizziness, seizures, syncope, weakness, light-headedness, numbness and headaches.  ?Hematological: Denies adenopathy. Easy bruising, personal or family bleeding history  ?Psychiatric/Behavioral: Denies suicidal ideation, mood changes, confusion, nervousness, sleep disturbance and  agitation ? ? ? ?Physical Exam: ?Vitals:  ? 03/09/22 0902  ?BP: 110/62  ?Pulse: 78  ?Temp: 97.6 ?F (36.4 ?C)  ?TempSrc: Oral  ?SpO2: 96%  ?Weight: 175 lb 9.6 oz (79.7 kg)  ?Height: 5' 7.5" (1.715 m)  ? ? ?Body mass index is 27.1 kg/m?. ? ? ?Constitutional: NAD, calm, comfortable ?Eyes: PERRL, lids and conjunctivae normal, wears corrective lenses ?ENMT: Mucous membranes are moist. Posterior pharynx is erythematous but clear of any exudate or lesions. Normal dentition. Tympanic membrane is pearly white, no erythema or bulging. ?Neck: normal, supple, no masses, no thyromegaly ?Respiratory: clear to auscultation bilaterally, no wheezing, no crackles. Normal respiratory effort. No accessory muscle use.  ?Cardiovascular: Regular rate and rhythm, no murmurs / rubs / gallops. No extremity edema. 2+ pedal pulses. No carotid bruits.  ?Abdomen: no tenderness, no masses palpated. No hepatosplenomegaly. Bowel sounds positive.  ?Musculoskeletal: no clubbing / cyanosis. No joint deformity upper and lower extremities. Good ROM, no contractures. Normal muscle tone.  ?Skin: no rashes, lesions, ulcers. No induration ?Neurologic: CN 2-12 grossly intact. Sensation intact, DTR normal. Strength 5/5 in all 4.  ?Psychiatric: Normal judgment and insight. Alert and oriented x 3. Normal mood.  ? ?Subsequent Medicare wellness visit ?  ?1. Risk factors, based on past  M,S,F -cardiovascular disease risk factors include age, gender, hypertension, hyperlipidemia ?  ?2.  Physical activities: Quite physically active ?  ?3.  Depression/mood: Stable, not depressed ?  ?4.  Hearing: No major perceived deficits ?  ?5.  ADL's: Independent in all ADLs ?  ?6.  Fall risk: Low fall risk ?  ?7.  Home safety: No problems identified ?  ?8.  Height weight, and visual acuity: height and weight as above, vision: ? ?Vision Screening  ? Right eye Left eye Both eyes  ?Without correction     ?With correction '20/20 20/25 20/20 '$  ? ?  ?9.  Counseling: Advised to update  Tdap ?  ?10. Lab orders based on risk factors: Laboratory update will be reviewed ?  ?11. Referral : None today ?  ?12. Care plan: Follow-up with me in 6 months ?  ?13. Cognitive assessment: No cognitive impairment ?  ?14. Screening: Patient provided with a written and personalized 5-10 year screening schedule in the AVS. yes ?  ?15. Provider List Update: PCP, dermatology ? ?16. Advance Directives: Full code ? ? ?17. Opioids: Patient is not on any opioid prescriptions and has no risk factors for a substance use disorder. ? ? ?[place] Office Visit from 03/09/2022 in [place] at [facility]  ?PHQ-9 Total Score 0  ? ?  ? ? ? ?  05/25/2020  ?  3:05 PM 02/13/2021  ? 10:45 AM 03/12/2021  ?  1:01 PM 03/09/2022  ?  7:31 AM 03/09/2022  ?  9:06 AM  ?Fall Risk  ?Falls in the past year?   0 0 0  ?Was there an injury with Fall?   0  0  ?Fall Risk Category Calculator   0  0  ?Fall Risk Category   Low  Low  ?Patient Fall Risk Level  Low fall risk Low fall risk   Low fall risk  ?Patient at Risk for Falls Due to     No Fall Risks  ?Fall risk Follow up     Falls evaluation completed  ? ? ? ?Impression and Plan: ? ?Encounter for preventive health examination  ?-Recommend routine eye and dental care. ?-Immunizations: He will get Tdap at pharmacy, other immunizations are up-to-date ?-Healthy lifestyle discussed in detail. ?-Labs to be updated today. ?-Colon cancer screening: 01/2020, 5-year callback ?-Breast cancer screening: Not applicable ?-Cervical cancer screening: Not applicable ?-Lung cancer screening: Not applicable, never smoker ?-Prostate cancer screening: PSA today ?-DEXA: Not applicable ? ?Primary hypertension ? - Plan: lisinopril-hydrochlorothiazide (ZESTORETIC) 20-12.5 MG tablet, amLODipine (NORVASC) 10 MG tablet, CBC with Differential/Platelet, Comprehensive metabolic panel, Hemoglobin A1c, Hemoglobin A1c, Comprehensive metabolic panel, CBC with Differential/Platelet ?-Blood pressures well controlled today on  amlodipine and lisinopril/hydrochlorothiazide. ? ?Hyperlipidemia, unspecified hyperlipidemia type  ?- Plan: Lipid panel, Lipid panel ? ?CKD (chronic kidney disease) stage 2, GFR 60-89 ml/min ?-Recheck renal function t

## 2022-05-07 ENCOUNTER — Ambulatory Visit: Payer: Medicare Other

## 2022-05-17 DIAGNOSIS — N528 Other male erectile dysfunction: Secondary | ICD-10-CM | POA: Diagnosis not present

## 2022-05-25 ENCOUNTER — Encounter: Payer: Self-pay | Admitting: Internal Medicine

## 2022-06-16 ENCOUNTER — Encounter: Payer: Self-pay | Admitting: Dermatology

## 2022-06-22 ENCOUNTER — Encounter: Payer: Self-pay | Admitting: Internal Medicine

## 2022-06-29 ENCOUNTER — Encounter: Payer: Self-pay | Admitting: Internal Medicine

## 2022-06-29 DIAGNOSIS — Z1283 Encounter for screening for malignant neoplasm of skin: Secondary | ICD-10-CM

## 2022-07-01 DIAGNOSIS — L821 Other seborrheic keratosis: Secondary | ICD-10-CM | POA: Diagnosis not present

## 2022-07-01 DIAGNOSIS — L218 Other seborrheic dermatitis: Secondary | ICD-10-CM | POA: Diagnosis not present

## 2022-07-01 DIAGNOSIS — L720 Epidermal cyst: Secondary | ICD-10-CM | POA: Diagnosis not present

## 2022-07-01 DIAGNOSIS — I788 Other diseases of capillaries: Secondary | ICD-10-CM | POA: Diagnosis not present

## 2022-09-01 ENCOUNTER — Encounter: Payer: Self-pay | Admitting: Internal Medicine

## 2022-09-01 DIAGNOSIS — E785 Hyperlipidemia, unspecified: Secondary | ICD-10-CM

## 2022-09-01 MED ORDER — PRAVASTATIN SODIUM 40 MG PO TABS: ORAL_TABLET | ORAL | 1 refills | Status: DC

## 2022-09-05 ENCOUNTER — Other Ambulatory Visit: Payer: Self-pay | Admitting: Internal Medicine

## 2022-09-05 DIAGNOSIS — K219 Gastro-esophageal reflux disease without esophagitis: Secondary | ICD-10-CM

## 2022-09-22 ENCOUNTER — Other Ambulatory Visit: Payer: Self-pay | Admitting: Internal Medicine

## 2022-09-22 DIAGNOSIS — I10 Essential (primary) hypertension: Secondary | ICD-10-CM

## 2022-09-22 NOTE — Telephone Encounter (Signed)
Vaccine information added as below.

## 2022-11-17 ENCOUNTER — Ambulatory Visit: Payer: Medicare Other | Admitting: Dermatology

## 2022-11-18 ENCOUNTER — Telehealth: Payer: Medicare Other | Admitting: Physician Assistant

## 2022-11-18 DIAGNOSIS — U071 COVID-19: Secondary | ICD-10-CM

## 2022-11-18 NOTE — Progress Notes (Signed)
   Thank you for the details you included in the comment boxes. Those details are very helpful in determining the best course of treatment for you and help [place] to provide the best care. Because you are Covid 19 positive and desiring Paxlovid, we recommend that you convert this visit to a video visit in order for the provider to better assess what is going on.  The provider will be able to give you a more accurate diagnosis and treatment plan if we can more freely discuss your symptoms and with the addition of a virtual examination.   If you convert to a video visit, we will bill your insurance (similar to an office visit) and you will not be charged for this e-Visit. You will be able to stay at home and speak with the first available [facility] advanced practice provider. The link to do a video visit is in the drop down Menu tab of your Welcome screen in [place].  Unfortunately, tonight we do not have any video slots remaining, however, you can go ahead and schedule an appointment for first thing in the morning and we will get you taken care of.  You will need to schedule the video visit. You can do this through one of two ways:  1) Go into your MyChart App and select the "Menu" button, then select the "Virtual Urgent Care Visit" then proceed scheduling -OR- 2) Go to [email or web address] and select "Get Started" under the Virtual Urgent Care option, select "View all options", then select the "Schedule on your Time" and proceed with scheduling.  Best Regards,  Grace Bushy, PA-C   I have spent 5 minutes in review of e-visit questionnaire, review and updating patient chart, medical decision making and response to patient.   Mar Daring, PA-C

## 2023-02-10 ENCOUNTER — Ambulatory Visit
Admission: RE | Admit: 2023-02-10 | Discharge: 2023-02-10 | Disposition: A | Discharge status: Home / Self Care | Payer: Medicare Other | Source: Ambulatory Visit | Attending: Nurse Practitioner | Admitting: Nurse Practitioner

## 2023-02-10 VITALS — BP 123/77 | HR 82 | Temp 97.5°F | Resp 16

## 2023-02-10 DIAGNOSIS — H6122 Impacted cerumen, left ear: Secondary | ICD-10-CM | POA: Diagnosis not present

## 2023-02-10 DIAGNOSIS — H938X2 Other specified disorders of left ear: Secondary | ICD-10-CM | POA: Diagnosis not present

## 2023-02-10 NOTE — Discharge Instructions (Signed)
Continue Flonase and you may increase to twice daily if it does not dry out your nose too much I would start an over-the-counter allergy medicine such as Claritin or Zyrtec daily for at least 7 days You may also take over-the-counter decongestant such as Sudafed as well Please follow-up with your PCP if your symptoms do not improve Please go to the emergency room for any worsening symptoms

## 2023-02-10 NOTE — ED Triage Notes (Signed)
Patient presents to UC for right ear fullness and nasal congestion since 4-5 days ago. Using flonase. Wants to be evaluated before traveling outside the country.   Denies fever.

## 2023-02-10 NOTE — ED Provider Notes (Signed)
[facility]    CSN: [id number] Arrival date & time: 02/10/23  1146      History   Chief Complaint Chief Complaint  Patient presents with   Ear Fullness    Sinus congestion and related symptoms - Entered by patient    HPI Dustin Hodges is a 72 y.o. male presents for evaluation of congestion and ear fullness.  Patient reports 4 to 5 days of some nasal congestion with postnasal drip, throat congestion with mild laryngitis, and right ear fullness.  Denies any cough, fevers, chills, body aches, sore throat.  No asthma or smoking history.  He has been using Flonase with some improvement.  He is traveling to [place] in 2 weeks and wants to make sure everything is okay.  He is also been cleaning his ears with alcohol.  No other concerns at this time.   Ear Fullness    Past Medical History:  Diagnosis Date   DDD (degenerative disc disease), lumbar    GERD (gastroesophageal reflux disease)    possibly and untreated    Hyperlipidemia    Hypertension     Patient Active Problem List   Diagnosis Date Noted   CKD (chronic kidney disease) stage 2, GFR 60-89 ml/min 11/14/2020   Hypertension    Hyperlipidemia    DDD (degenerative disc disease), lumbar     Past Surgical History:  Procedure Laterality Date   COLONOSCOPY  last 2015-2016   last colon in [place] 2015- normal    KNEE ARTHROSCOPY     meniscus tear    POLYPECTOMY  2010   benign - pt unsure of lb path on polyp        Home Medications    Prior to Admission medications   Medication Sig Start Date End Date Taking? Authorizing Provider  amLODipine (NORVASC) 10 MG tablet TAKE 1 TABLET(10 MG) BY MOUTH DAILY 03/09/22   Philip Aspen, Limmie Patricia, MD  aspirin EC 81 MG tablet Take 81 mg by mouth daily.    [provider]  Cholecalciferol (VITAMIN D) 50 MCG (2000 UT) tablet Take 2,000 Units by mouth daily.    [provider]  Clobetasol Prop Emollient Base (CLOBETASOL PROPIONATE E) 0.05 %  emollient cream Apply 1 application topically 2 (two) times daily. Onto scalp 07/15/20   Janalyn Harder, MD  lisinopril-hydrochlorothiazide (ZESTORETIC) 20-12.5 MG tablet TAKE 1 TABLET BY MOUTH TWICE DAILY 09/22/22   Philip Aspen, Limmie Patricia, MD  pantoprazole (PROTONIX) 40 MG tablet TAKE 1 TABLET(40 MG) BY MOUTH DAILY 09/06/22   Philip Aspen, Limmie Patricia, MD  pravastatin (PRAVACHOL) 40 MG tablet TAKE 1 TABLET(40 MG) BY MOUTH AT BEDTIME 09/01/22   Philip Aspen, Limmie Patricia, MD  triamcinolone cream (KENALOG) 0.1 % Apply 1 application topically 2 (two) times daily. 12/13/19   Henderson Cloud, MD    Family History Family History  Problem Relation Age of Onset   CVA Mother    Heart failure Father    Colon cancer Neg Hx    Colon polyps Neg Hx    Esophageal cancer Neg Hx    Rectal cancer Neg Hx    Stomach cancer Neg Hx     Social History Social History   Tobacco Use   Smoking status: Never   Smokeless tobacco: Never  Vaping Use   Vaping Use: Never used  Substance Use Topics   Alcohol use: Yes    Comment: occasional   Drug use: Never     Allergies  Patient has no known allergies.   Review of Systems Review of Systems  HENT:  Positive for congestion, postnasal drip and voice change.        Left ear fullness     Physical Exam Triage Vital Signs ED Triage Vitals [02/10/23 1153]  Enc Vitals Group     BP 123/77     Pulse Rate 82     Resp 16     Temp (!) 97.5 F (36.4 C)     Temp Source Oral     SpO2 95 %     Weight      Height      Head Circumference      Peak Flow      Pain Score 0     Pain Loc      Pain Edu?      Excl. in GC?    No data found.  Updated Vital Signs BP 123/77 (BP Location: Right Arm)   Pulse 82   Temp (!) 97.5 F (36.4 C) (Oral)   Resp 16   SpO2 95%   Visual Acuity Right Eye Distance:   Left Eye Distance:   Bilateral Distance:    Right Eye Near:   Left Eye Near:    Bilateral Near:     Physical Exam Vitals and nursing  note reviewed.  Constitutional:      General: He is not in acute distress.    Appearance: Normal appearance. He is not ill-appearing or toxic-appearing.  HENT:     Head: Normocephalic and atraumatic.     Right Ear: Tympanic membrane and ear canal normal.     Left Ear: There is impacted cerumen.     Nose: Congestion present.     Mouth/Throat:     Mouth: Mucous membranes are moist.     Pharynx: No oropharyngeal exudate or posterior oropharyngeal erythema.  Eyes:     Pupils: Pupils are equal, round, and reactive to light.  Cardiovascular:     Rate and Rhythm: Normal rate and regular rhythm.     Heart sounds: Normal heart sounds.  Pulmonary:     Effort: Pulmonary effort is normal.     Breath sounds: Normal breath sounds.  Musculoskeletal:     Cervical back: Normal range of motion and neck supple.  Lymphadenopathy:     Cervical: No cervical adenopathy.  Skin:    General: Skin is warm and dry.  Neurological:     General: No focal deficit present.     Mental Status: He is alert and oriented to person, place, and time.  Psychiatric:        Mood and Affect: Mood normal.        Behavior: Behavior normal.      UC Treatments / Results  Labs (all labs ordered are listed, but only abnormal results are displayed) Labs Reviewed - No data to display  EKG   Radiology No results found.  Procedures Procedures (including critical care time)  Medications Ordered in UC Medications - No data to display  Initial Impression / Assessment and Plan / UC Course  I have reviewed the triage vital signs and the nursing notes.  Pertinent labs & imaging results that were available during my care of the patient were reviewed by me and considered in my medical decision making (see chart for details).     Reviewed exam and symptoms with patient.  No red flags.  After irrigation TM visualized and is of within normal limits.  Patient  does report hearing and fullness has improved.  Discussed  cerumen impaction and avoidance of Q-tips and earbuds Should continue Flonase and may increase to twice daily if tolerates I would also advise over-the-counter allergy medicine daily for at least 7 days.  He may also use over-the-counter decongestants as needed PCP follow-up if symptoms do not improve ER precautions reviewed and patient verbalized understanding Final Clinical Impressions(s) / UC Diagnoses   Final diagnoses:  Ear fullness, left  Impacted cerumen of left ear     Discharge Instructions      Continue Flonase and you may increase to twice daily if it does not dry out your nose too much I would start an over-the-counter allergy medicine such as Claritin or Zyrtec daily for at least 7 days You may also take over-the-counter decongestant such as Sudafed as well Please follow-up with your PCP if your symptoms do not improve Please go to the emergency room for any worsening symptoms     ED Prescriptions   None    PDMP not reviewed this encounter.   Radford PaxMayer, Jodi R, NP 02/10/23 1240

## 2023-03-06 ENCOUNTER — Other Ambulatory Visit: Payer: Self-pay | Admitting: Internal Medicine

## 2023-03-06 DIAGNOSIS — K219 Gastro-esophageal reflux disease without esophagitis: Secondary | ICD-10-CM

## 2023-03-11 ENCOUNTER — Ambulatory Visit
Admission: RE | Admit: 2023-03-11 | Discharge: 2023-03-11 | Disposition: A | Discharge status: Home / Self Care | Payer: Medicare Other | Source: Ambulatory Visit | Attending: Urgent Care | Admitting: Urgent Care

## 2023-03-11 VITALS — BP 149/89 | HR 83 | Temp 98.7°F | Resp 20

## 2023-03-11 DIAGNOSIS — J018 Other acute sinusitis: Secondary | ICD-10-CM | POA: Diagnosis not present

## 2023-03-11 MED ORDER — AMOXICILLIN 875 MG PO TABS: 875.0000 mg | ORAL_TABLET | Freq: Two times a day (BID) | ORAL | 0 refills | Status: DC

## 2023-03-11 MED ORDER — PSEUDOEPHEDRINE HCL 30 MG PO TABS: 30.0000 mg | ORAL_TABLET | Freq: Three times a day (TID) | ORAL | 0 refills | Status: DC | PRN

## 2023-03-11 NOTE — ED Triage Notes (Signed)
Pt presents with complaints of sinus congestion x 5-8 days. Pt is having frontal pressure and thick drainage. Patient is having off and on fever and cough. Denies chills, sweats, or body aches. 95% pulse ox at home. Pt has had flonase and mucinex at home. Pt had similar symptoms in April but symptoms went away and came back.

## 2023-03-11 NOTE — Discharge Instructions (Signed)
We will manage this as a sinus infection with amoxicillin. For sore throat or cough try using a honey-based tea. Use 3 teaspoons of honey with juice squeezed from half lemon. Place shaved pieces of ginger into 1/2-1 cup of water and warm over stove top. Then mix the ingredients and repeat every 4 hours as needed. Please take ibuprofen 600mg  every 6 hours with food alternating with OR taken together with Tylenol 650mg  every 6 hours for throat pain, fevers, aches and pains. Hydrate very well with at least 2 liters of water. Eat light meals such as soups (chicken and noodles, vegetable, chicken and wild rice).  Do not eat foods that you are allergic to.  Taking an antihistamine like Zyrtec can help against postnasal drainage, sinus congestion which can cause sinus pain, sinus headaches, throat pain, painful swallowing, coughing.  You can take this together with pseudoephedrine (Sudafed) at a dose of 30 mg 3 times a day or twice daily as needed for the same kind of nasal drip, congestion.  Use cough medication as needed.

## 2023-03-11 NOTE — ED Provider Notes (Signed)
[facility]  Note:  This document was prepared using Conservation officer, historic buildings and may include unintentional dictation errors.  MRN: [id number] DOB: [birth year]  Subjective:   Dustin Hodges is a 72 y.o. male presenting for 8 day history of acute onset sinus congestion, sinus drainage, frontal sinus pressure and pain. Has had intermittent fever, coughing. No chest pain, shob. No asthma. Is taking allergies. Was sick in April too but felt like he recovered some. No smoking of any kind including cigarettes, cigars, vaping, marijuana use.    No current facility-administered medications for this encounter.  Current Outpatient Medications:    amLODipine (NORVASC) 10 MG tablet, TAKE 1 TABLET(10 MG) BY MOUTH DAILY, Disp: 90 tablet, Rfl: 1   aspirin EC 81 MG tablet, Take 81 mg by mouth daily., Disp: , Rfl:    Cholecalciferol (VITAMIN D) 50 MCG (2000 UT) tablet, Take 2,000 Units by mouth daily., Disp: , Rfl:    Clobetasol Prop Emollient Base (CLOBETASOL PROPIONATE E) 0.05 % emollient cream, Apply 1 application topically 2 (two) times daily. Onto scalp, Disp: 30 g, Rfl: 2   lisinopril-hydrochlorothiazide (ZESTORETIC) 20-12.5 MG tablet, TAKE 1 TABLET BY MOUTH TWICE DAILY, Disp: 180 tablet, Rfl: 1   pantoprazole (PROTONIX) 40 MG tablet, TAKE 1 TABLET(40 MG) BY MOUTH DAILY, Disp: 90 tablet, Rfl: 0   pravastatin (PRAVACHOL) 40 MG tablet, TAKE 1 TABLET(40 MG) BY MOUTH AT BEDTIME, Disp: 90 tablet, Rfl: 1   triamcinolone cream (KENALOG) 0.1 %, Apply 1 application topically 2 (two) times daily., Disp: 30 g, Rfl: 1   No Known Allergies  Past Medical History:  Diagnosis Date   DDD (degenerative disc disease), lumbar    GERD (gastroesophageal reflux disease)    possibly and untreated    Hyperlipidemia    Hypertension      Past Surgical History:  Procedure Laterality Date   COLONOSCOPY  last 2015-2016   last colon in [place] 2015- normal    KNEE ARTHROSCOPY      meniscus tear    POLYPECTOMY  2010   benign - pt unsure of lb path on polyp     Family History  Problem Relation Age of Onset   CVA Mother    Heart failure Father    Colon cancer Neg Hx    Colon polyps Neg Hx    Esophageal cancer Neg Hx    Rectal cancer Neg Hx    Stomach cancer Neg Hx     Social History   Tobacco Use   Smoking status: Never   Smokeless tobacco: Never  Vaping Use   Vaping Use: Never used  Substance Use Topics   Alcohol use: Yes    Comment: occasional   Drug use: Never    ROS   Objective:   Vitals: BP (!) 149/89 (BP Location: Right Arm)   Pulse 83   Temp 98.7 F (37.1 C) (Oral)   Resp 20   SpO2 95%   Physical Exam Constitutional:      General: He is not in acute distress.    Appearance: Normal appearance. He is well-developed and normal weight. He is not ill-appearing, toxic-appearing or diaphoretic.  HENT:     Head: Normocephalic and atraumatic.     Right Ear: Tympanic membrane, ear canal and external ear normal. No drainage, swelling or tenderness. No middle ear effusion. There is no impacted cerumen. Tympanic membrane is not erythematous or bulging.     Left Ear: Tympanic membrane, ear canal and  external ear normal. No drainage, swelling or tenderness.  No middle ear effusion. There is no impacted cerumen. Tympanic membrane is not erythematous or bulging.     Nose: Nose normal. No congestion or rhinorrhea.     Mouth/Throat:     Mouth: Mucous membranes are moist.     Pharynx: No oropharyngeal exudate or posterior oropharyngeal erythema.  Eyes:     General: No scleral icterus.       Right eye: No discharge.        Left eye: No discharge.     Extraocular Movements: Extraocular movements intact.     Conjunctiva/sclera: Conjunctivae normal.  Cardiovascular:     Rate and Rhythm: Normal rate and regular rhythm.     Heart sounds: Normal heart sounds. No murmur heard.    No friction rub. No gallop.  Pulmonary:     Effort: Pulmonary effort is  normal. No respiratory distress.     Breath sounds: Normal breath sounds. No stridor. No wheezing, rhonchi or rales.  Musculoskeletal:     Cervical back: Normal range of motion and neck supple. No rigidity. No muscular tenderness.  Neurological:     General: No focal deficit present.     Mental Status: He is alert and oriented to person, place, and time.  Psychiatric:        Mood and Affect: Mood normal.        Behavior: Behavior normal.        Thought Content: Thought content normal.     Assessment and Plan :   PDMP not reviewed this encounter.  1. Acute non-recurrent sinusitis of other sinus    Will start empiric treatment for sinusitis with amoxicillin.  Recommended supportive care otherwise. Counseled patient on potential for adverse effects with medications prescribed/recommended today, ER and return-to-clinic precautions discussed, patient verbalized understanding.    Wallis Bamberg, PA-C 03/11/23 1450

## 2023-03-15 ENCOUNTER — Encounter: Payer: Self-pay | Admitting: Internal Medicine

## 2023-03-15 ENCOUNTER — Ambulatory Visit (INDEPENDENT_AMBULATORY_CARE_PROVIDER_SITE_OTHER): Payer: Medicare Other | Admitting: Internal Medicine

## 2023-03-15 VITALS — BP 120/84 | HR 82 | Temp 97.7°F | Ht 68.0 in | Wt 170.6 lb

## 2023-03-15 DIAGNOSIS — I10 Essential (primary) hypertension: Secondary | ICD-10-CM | POA: Diagnosis not present

## 2023-03-15 DIAGNOSIS — N182 Chronic kidney disease, stage 2 (mild): Secondary | ICD-10-CM | POA: Diagnosis not present

## 2023-03-15 DIAGNOSIS — Z1159 Encounter for screening for other viral diseases: Secondary | ICD-10-CM | POA: Diagnosis not present

## 2023-03-15 DIAGNOSIS — Z125 Encounter for screening for malignant neoplasm of prostate: Secondary | ICD-10-CM

## 2023-03-15 DIAGNOSIS — Z0000 Encounter for general adult medical examination without abnormal findings: Secondary | ICD-10-CM

## 2023-03-15 DIAGNOSIS — E785 Hyperlipidemia, unspecified: Secondary | ICD-10-CM | POA: Diagnosis not present

## 2023-03-15 LAB — COMPREHENSIVE METABOLIC PANEL
ALT: 17 U/L (ref 0–53)
AST: 18 U/L (ref 0–37)
Albumin: 3.7 g/dL (ref 3.5–5.2)
Alkaline Phosphatase: 69 U/L (ref 39–117)
BUN: 17 mg/dL (ref 6–23)
CO2: 28 mEq/L (ref 19–32)
Calcium: 9.1 mg/dL (ref 8.4–10.5)
Chloride: 95 mEq/L — ABNORMAL LOW (ref 96–112)
Creatinine, Ser: 1.07 mg/dL (ref 0.40–1.50)
GFR: 70.8 mL/min (ref >60.00)
Glucose, Bld: 88 mg/dL (ref 70–99)
Potassium: 4.6 mEq/L (ref 3.5–5.1)
Sodium: 131 mEq/L — ABNORMAL LOW (ref 135–145)
Total Bilirubin: 0.7 mg/dL (ref 0.2–1.2)
Total Protein: 7 g/dL (ref 6.0–8.3)

## 2023-03-15 LAB — LIPID PANEL
Cholesterol: 144 mg/dL (ref 0–200)
HDL: 31.6 mg/dL — ABNORMAL LOW (ref >39.00)
LDL Cholesterol: 95 mg/dL (ref 0–99)
NonHDL: 111.93
Total CHOL/HDL Ratio: 5
Triglycerides: 86 mg/dL (ref 0.0–149.0)
VLDL: 17.2 mg/dL (ref 0.0–40.0)

## 2023-03-15 LAB — CBC WITH DIFFERENTIAL/PLATELET
Basophils Absolute: 0 10*3/uL (ref 0.0–0.1)
Basophils Relative: 0.4 % (ref 0.0–3.0)
Eosinophils Absolute: 0.2 10*3/uL (ref 0.0–0.7)
Eosinophils Relative: 2 % (ref 0.0–5.0)
HCT: 43 % (ref 39.0–52.0)
Hemoglobin: 15.3 g/dL (ref 13.0–17.0)
Lymphocytes Relative: 11.4 % — ABNORMAL LOW (ref 12.0–46.0)
Lymphs Abs: 1 10*3/uL (ref 0.7–4.0)
MCHC: 35.7 g/dL (ref 30.0–36.0)
MCV: 84.7 fl (ref 78.0–100.0)
Monocytes Absolute: 0.9 10*3/uL (ref 0.1–1.0)
Monocytes Relative: 9.9 % (ref 3.0–12.0)
Neutro Abs: 6.8 10*3/uL (ref 1.4–7.7)
Neutrophils Relative %: 76.3 % (ref 43.0–77.0)
Platelets: 356 10*3/uL (ref 150.0–400.0)
RBC: 5.07 Mil/uL (ref 4.22–5.81)
RDW: 12.9 % (ref 11.5–15.5)
WBC: 9 10*3/uL (ref 4.0–10.5)

## 2023-03-15 LAB — PSA: PSA: 1.93 ng/mL (ref 0.10–4.00)

## 2023-03-15 NOTE — Progress Notes (Signed)
Established Patient Office Visit     CC/Reason for Visit: Annual preventive exam and subsequent Medicare wellness visit  HPI: Dustin Hodges is a 72 y.o. male who is coming in today for the above mentioned reasons. Past Medical History is significant for: Hypertension, hyperlipidemia, GERD, chronic kidney disease stage II.  He has been doing very well in the last year and has no acute concerns or complaints.  He has routine eye and dental care, only minimal hearing issues.  He exercises by doing yoga and walking.  He appears to be due for Tdap and shingles vaccination, he will obtain records from pharmacy and update as needed.  He had a colonoscopy in 2021.   Past Medical/Surgical History: Past Medical History:  Diagnosis Date   DDD (degenerative disc disease), lumbar    GERD (gastroesophageal reflux disease)    possibly and untreated    Hyperlipidemia    Hypertension     Past Surgical History:  Procedure Laterality Date   COLONOSCOPY  last 2015-2016   last colon in [place] 2015- normal    KNEE ARTHROSCOPY     meniscus tear    POLYPECTOMY  2010   benign - pt unsure of lb path on polyp     Social History:  reports that he has never smoked. He has never used smokeless tobacco. He reports current alcohol use. He reports that he does not use drugs.  Allergies: No Known Allergies  Family History:  Family History  Problem Relation Age of Onset   CVA Mother    Heart failure Father    Colon cancer Neg Hx    Colon polyps Neg Hx    Esophageal cancer Neg Hx    Rectal cancer Neg Hx    Stomach cancer Neg Hx      Current Outpatient Medications:    amLODipine (NORVASC) 10 MG tablet, TAKE 1 TABLET(10 MG) BY MOUTH DAILY, Disp: 90 tablet, Rfl: 1   amoxicillin (AMOXIL) 875 MG tablet, Take 1 tablet (875 mg total) by mouth 2 (two) times daily., Disp: 14 tablet, Rfl: 0   Cholecalciferol (VITAMIN D) 50 MCG (2000 UT) tablet, Take 2,000 Units by mouth daily., Disp: , Rfl:     lisinopril-hydrochlorothiazide (ZESTORETIC) 20-12.5 MG tablet, TAKE 1 TABLET BY MOUTH TWICE DAILY, Disp: 180 tablet, Rfl: 1   pantoprazole (PROTONIX) 40 MG tablet, TAKE 1 TABLET(40 MG) BY MOUTH DAILY, Disp: 90 tablet, Rfl: 0   pravastatin (PRAVACHOL) 40 MG tablet, TAKE 1 TABLET(40 MG) BY MOUTH AT BEDTIME, Disp: 90 tablet, Rfl: 1   aspirin EC 81 MG tablet, Take 81 mg by mouth daily. (Patient not taking: Reported on 03/14/2023), Disp: , Rfl:   Review of Systems:  Negative unless indicated in HPI.   Physical Exam: Vitals:   03/15/23 0757  BP: 120/84  Pulse: 82  Temp: 97.7 F (36.5 C)  TempSrc: Oral  SpO2: 98%  Weight: 170 lb 9.6 oz (77.4 kg)  Height: 5\' 8"  (1.727 m)    Body mass index is 25.94 kg/m.   Physical Exam Vitals reviewed.  Constitutional:      General: He is not in acute distress.    Appearance: Normal appearance. He is not ill-appearing, toxic-appearing or diaphoretic.  HENT:     Head: Normocephalic.     Right Ear: Tympanic membrane, ear canal and external ear normal. There is no impacted cerumen.     Left Ear: Tympanic membrane, ear canal and external ear normal. There is no impacted  cerumen.     Nose: Nose normal.     Mouth/Throat:     Mouth: Mucous membranes are moist.     Pharynx: Oropharynx is clear. No oropharyngeal exudate or posterior oropharyngeal erythema.  Eyes:     General: No scleral icterus.       Right eye: No discharge.        Left eye: No discharge.     Conjunctiva/sclera: Conjunctivae normal.     Pupils: Pupils are equal, round, and reactive to light.  Neck:     Vascular: No carotid bruit.  Cardiovascular:     Rate and Rhythm: Normal rate and regular rhythm.     Pulses: Normal pulses.     Heart sounds: Normal heart sounds.  Pulmonary:     Effort: Pulmonary effort is normal. No respiratory distress.     Breath sounds: Normal breath sounds.  Abdominal:     General: Abdomen is flat. Bowel sounds are normal.     Palpations: Abdomen is soft.   Musculoskeletal:        General: Normal range of motion.     Cervical back: Normal range of motion.  Skin:    General: Skin is warm and dry.  Neurological:     General: No focal deficit present.     Mental Status: He is alert and oriented to person, place, and time. Mental status is at baseline.  Psychiatric:        Mood and Affect: Mood normal.        Behavior: Behavior normal.        Thought Content: Thought content normal.        Judgment: Judgment normal.      Subsequent Medicare wellness visit   1. Risk factors, based on past  M,S,F - Cardiac Risk Factors include: advanced age (>65men, >40 women);hypertension;male gender   2.  Physical activities: Dietary issues and exercise activities discussed:  Current Exercise Habits: Home exercise routine;Structured exercise class, Type of exercise: yoga;walking, Time (Minutes): 50, Frequency (Times/Week): 5, Weekly Exercise (Minutes/Week): 250, Intensity: Moderate   3.  Depression/mood:  Flowsheet Row Office Visit from 03/09/2022 in [facility] Total Score 0        4.  ADL's:    03/14/2023    4:33 PM  In your present state of health, do you have any difficulty performing the following activities:  Hearing? 1  Comment sometimes  Vision? 0  Difficulty concentrating or making decisions? 0  Walking or climbing stairs? 0  Dressing or bathing? 0  Doing errands, shopping? 0  Preparing Food and eating ? N  Using the Toilet? N  In the past six months, have you accidently leaked urine? N  Do you have problems with loss of bowel control? N  Managing your Medications? N  Managing your Finances? N  Housekeeping or managing your Housekeeping? N     5.  Fall risk:     02/13/2021   10:45 AM 03/12/2021    1:01 PM 03/09/2022    7:31 AM 03/09/2022    9:06 AM 03/14/2023    4:36 PM  Fall Risk  Falls in the past year?  0 0 0 0  Was there an injury with Fall?  0  0 0  Fall Risk Category Calculator  0   0 0  Fall Risk Category (Retired)  Low  Low   (RETIRED) Patient Fall Risk Level Low fall risk   Low fall risk  Patient at Risk for Falls Due to    No Fall Risks   Fall risk Follow up    Falls evaluation completed Falls evaluation completed     6.  Home safety: No problems identified   7.  Height weight, and visual acuity: height and weight as above, vision/hearing: Vision Screening   Right eye Left eye Both eyes  Without correction     With correction 20/25 20/25 20/25      8.  Counseling: Counseling given: Not Answered    9. Lab orders based on risk factors: Laboratory update will be reviewed   10. Cognitive assessment:        03/14/2023    4:37 PM  6CIT Screen  What Year? 0 points  What month? 0 points  What time? 0 points  Count back from 20 0 points  Months in reverse 0 points  Repeat phrase 0 points  Total Score 0 points     11. Screening: Patient provided with a written and personalized 5-10 year screening schedule in the AVS. Health Maintenance  Topic Date Due   Hepatitis C Screening: USPSTF Recommendation to screen - Ages 28-79 yo.  Never done   Zoster (Shingles) Vaccine (1 of 2) Never done   DTaP/Tdap/Td vaccine (2 - Tdap) 11/01/2021   COVID-19 Vaccine (4 - 2023-24 season) 07/02/2022   Flu Shot  06/02/2023   Medicare Annual Wellness Visit  03/14/2024   Pneumonia Vaccine (3 of 3 - PPSV23 or PCV20) 03/04/2025   Colon Cancer Screening  02/13/2027   HPV Vaccine  Aged Out    12. Provider List Update: Patient Care Team    Relationship Specialty Notifications Start End  Philip Aspen, Limmie Patricia, MD PCP - General Internal Medicine  12/13/19   Janalyn Harder, MD (Inactive) Consulting Physician Dermatology  07/15/20      13. Advance Directives: Does Patient Have a Medical Advance Directive?: Yes Type of Advance Directive: Healthcare Power of Attorney, Living will, Out of facility DNR (pink MOST or yellow form) Does patient want to make changes to medical  advance directive?: No - Patient declined Copy of Healthcare Power of Attorney in Chart?: No - copy requested  14. Opioids: Patient is not on any opioid prescriptions and has no risk factors for a substance use disorder.   15.   Goals      Protect My Health     Productive garden         I have personally reviewed and noted the following in the patient's chart:   Medical and social history Use of alcohol, tobacco or illicit drugs  Current medications and supplements Functional ability and status Nutritional status Physical activity Advanced directives List of other physicians Hospitalizations, surgeries, and ER visits in previous 12 months Vitals Screenings to include cognitive, depression, and falls Referrals and appointments  In addition, I have reviewed and discussed with patient certain preventive protocols, quality metrics, and best practice recommendations. A written personalized care plan for preventive services as well as general preventive health recommendations were provided to patient.  Impression and Plan:  Medicare annual wellness visit, subsequent  Hyperlipidemia, unspecified hyperlipidemia type -     Lipid panel; Future  Essential hypertension -     CBC with Differential/Platelet; Future -     Comprehensive metabolic panel; Future  CKD (chronic kidney disease) stage 2, GFR 60-89 ml/min  Encounter for hepatitis C screening test for low risk patient -     Hepatitis C antibody; Future  Prostate  cancer screening -     PSA; Future   -Recommend routine eye and dental care. -Healthy lifestyle discussed in detail. -Labs to be updated today. -Prostate cancer screening: PSA today. Health Maintenance  Topic Date Due   Hepatitis C Screening: USPSTF Recommendation to screen - Ages 76-79 yo.  Never done   Zoster (Shingles) Vaccine (1 of 2) Never done   DTaP/Tdap/Td vaccine (2 - Tdap) 11/01/2021   COVID-19 Vaccine (4 - 2023-24 season) 07/02/2022   Flu  Shot  06/02/2023   Medicare Annual Wellness Visit  03/14/2024   Pneumonia Vaccine (3 of 3 - PPSV23 or PCV20) 03/04/2025   Colon Cancer Screening  02/13/2027   HPV Vaccine  Aged Out     -Will obtain shingles and Tdap at pharmacy.    Chaya Jan, MD [id number]

## 2023-03-16 ENCOUNTER — Encounter: Payer: Self-pay | Admitting: Internal Medicine

## 2023-03-16 LAB — HEPATITIS C ANTIBODY: Hepatitis C Ab: NONREACTIVE

## 2023-03-18 ENCOUNTER — Other Ambulatory Visit: Payer: Self-pay | Admitting: Internal Medicine

## 2023-03-18 DIAGNOSIS — I10 Essential (primary) hypertension: Secondary | ICD-10-CM

## 2023-03-19 ENCOUNTER — Other Ambulatory Visit: Payer: Self-pay | Admitting: Internal Medicine

## 2023-03-19 DIAGNOSIS — E785 Hyperlipidemia, unspecified: Secondary | ICD-10-CM

## 2023-04-14 DIAGNOSIS — H25813 Combined forms of age-related cataract, bilateral: Secondary | ICD-10-CM | POA: Diagnosis not present

## 2023-05-31 ENCOUNTER — Other Ambulatory Visit: Payer: Self-pay | Admitting: Internal Medicine

## 2023-05-31 DIAGNOSIS — K219 Gastro-esophageal reflux disease without esophagitis: Secondary | ICD-10-CM

## 2023-06-12 ENCOUNTER — Other Ambulatory Visit: Payer: Self-pay | Admitting: Internal Medicine

## 2023-06-12 DIAGNOSIS — I10 Essential (primary) hypertension: Secondary | ICD-10-CM

## 2023-07-06 DIAGNOSIS — D225 Melanocytic nevi of trunk: Secondary | ICD-10-CM | POA: Diagnosis not present

## 2023-07-06 DIAGNOSIS — L820 Inflamed seborrheic keratosis: Secondary | ICD-10-CM | POA: Diagnosis not present

## 2023-07-06 DIAGNOSIS — L814 Other melanin hyperpigmentation: Secondary | ICD-10-CM | POA: Diagnosis not present

## 2023-07-06 DIAGNOSIS — L918 Other hypertrophic disorders of the skin: Secondary | ICD-10-CM | POA: Diagnosis not present

## 2023-07-06 DIAGNOSIS — L218 Other seborrheic dermatitis: Secondary | ICD-10-CM | POA: Diagnosis not present

## 2023-07-14 DIAGNOSIS — L738 Other specified follicular disorders: Secondary | ICD-10-CM | POA: Diagnosis not present

## 2023-07-14 DIAGNOSIS — L720 Epidermal cyst: Secondary | ICD-10-CM | POA: Diagnosis not present

## 2023-08-29 ENCOUNTER — Other Ambulatory Visit: Payer: Self-pay | Admitting: Internal Medicine

## 2023-08-29 DIAGNOSIS — K219 Gastro-esophageal reflux disease without esophagitis: Secondary | ICD-10-CM

## 2023-09-11 ENCOUNTER — Other Ambulatory Visit: Payer: Self-pay | Admitting: Internal Medicine

## 2023-09-11 DIAGNOSIS — I10 Essential (primary) hypertension: Secondary | ICD-10-CM

## 2023-09-26 ENCOUNTER — Other Ambulatory Visit: Payer: Self-pay | Admitting: Internal Medicine

## 2023-09-26 DIAGNOSIS — E785 Hyperlipidemia, unspecified: Secondary | ICD-10-CM

## 2023-11-24 ENCOUNTER — Other Ambulatory Visit: Payer: Self-pay | Admitting: Internal Medicine

## 2023-11-24 DIAGNOSIS — K219 Gastro-esophageal reflux disease without esophagitis: Secondary | ICD-10-CM

## 2023-12-27 ENCOUNTER — Other Ambulatory Visit: Payer: Self-pay | Admitting: Internal Medicine

## 2023-12-27 DIAGNOSIS — E785 Hyperlipidemia, unspecified: Secondary | ICD-10-CM

## 2023-12-28 ENCOUNTER — Other Ambulatory Visit: Payer: Self-pay | Admitting: Internal Medicine

## 2023-12-28 DIAGNOSIS — I10 Essential (primary) hypertension: Secondary | ICD-10-CM

## 2024-01-31 ENCOUNTER — Ambulatory Visit (INDEPENDENT_AMBULATORY_CARE_PROVIDER_SITE_OTHER): Admitting: Internal Medicine

## 2024-01-31 VITALS — BP 120/70 | HR 67 | Temp 97.4°F | Wt 174.8 lb

## 2024-01-31 DIAGNOSIS — H938X1 Other specified disorders of right ear: Secondary | ICD-10-CM

## 2024-01-31 NOTE — Progress Notes (Signed)
     Established Patient Office Visit     CC/Reason for Visit: Right ear congestion  HPI: Dustin Hodges is a 73 y.o. male who is coming in today for the above mentioned reasons.  Right ear congestion and pressure has been present for about 7 days.  In the last 2 days he has started noticing some nasal congestion and a slight frontal headache.  No fever, no ear pain.   Past Medical/Surgical History: Past Medical History:  Diagnosis Date   DDD (degenerative disc disease), lumbar    GERD (gastroesophageal reflux disease)    possibly and untreated    Hyperlipidemia    Hypertension     Past Surgical History:  Procedure Laterality Date   COLONOSCOPY  last 2015-2016   last colon in [place] 2015- normal    KNEE ARTHROSCOPY     meniscus tear    POLYPECTOMY  2010   benign - pt unsure of lb path on polyp     Social History:  reports that he has never smoked. He has never used smokeless tobacco. He reports current alcohol use. He reports that he does not use drugs.  Allergies: No Known Allergies  Family History:  Family History  Problem Relation Age of Onset   CVA Mother    Heart failure Father    Colon cancer Neg Hx    Colon polyps Neg Hx    Esophageal cancer Neg Hx    Rectal cancer Neg Hx    Stomach cancer Neg Hx      Current Outpatient Medications:    amLODipine (NORVASC) 10 MG tablet, TAKE 1 TABLET(10 MG) BY MOUTH DAILY, Disp: 90 tablet, Rfl: 1   Cholecalciferol (VITAMIN D) 50 MCG (2000 UT) tablet, Take 2,000 Units by mouth daily., Disp: , Rfl:    lisinopril-hydrochlorothiazide (ZESTORETIC) 20-12.5 MG tablet, TAKE 1 TABLET BY MOUTH TWICE DAILY, Disp: 180 tablet, Rfl: 1   pantoprazole (PROTONIX) 40 MG tablet, TAKE 1 TABLET(40 MG) BY MOUTH DAILY, Disp: 90 tablet, Rfl: 1   pravastatin (PRAVACHOL) 40 MG tablet, TAKE 1 TABLET(40 MG) BY MOUTH AT BEDTIME, Disp: 90 tablet, Rfl: 1  Review of Systems:  Negative unless indicated in HPI.   Physical Exam: Vitals:    01/31/24 1542  BP: 120/70  Pulse: 67  Temp: (!) 97.4 F (36.3 C)  TempSrc: Oral  SpO2: 98%  Weight: 174 lb 12.8 oz (79.3 kg)    Body mass index is 26.58 kg/m.   Physical Exam HENT:     Right Ear: Tympanic membrane, ear canal and external ear normal.     Left Ear: Tympanic membrane, ear canal and external ear normal.      Impression and Plan:  Ear pressure, right  -Suspect eustachian tube dysfunction due to seasonal allergies.  Advised daily antihistamine and guaifenesin.   Time spent:23 minutes reviewing chart, interviewing and examining patient and formulating plan of care.     Chaya Jan, MD [facility]

## 2024-02-06 ENCOUNTER — Encounter: Payer: Self-pay | Admitting: Internal Medicine

## 2024-02-06 DIAGNOSIS — H938X1 Other specified disorders of right ear: Secondary | ICD-10-CM

## 2024-02-15 ENCOUNTER — Encounter (INDEPENDENT_AMBULATORY_CARE_PROVIDER_SITE_OTHER): Payer: Self-pay | Admitting: Physician Assistant

## 2024-02-15 ENCOUNTER — Ambulatory Visit (INDEPENDENT_AMBULATORY_CARE_PROVIDER_SITE_OTHER): Admitting: Physician Assistant

## 2024-02-15 ENCOUNTER — Ambulatory Visit (INDEPENDENT_AMBULATORY_CARE_PROVIDER_SITE_OTHER): Admitting: Audiology

## 2024-02-15 DIAGNOSIS — H6093 Unspecified otitis externa, bilateral: Secondary | ICD-10-CM | POA: Diagnosis not present

## 2024-02-15 DIAGNOSIS — H9193 Unspecified hearing loss, bilateral: Secondary | ICD-10-CM

## 2024-02-15 DIAGNOSIS — H6123 Impacted cerumen, bilateral: Secondary | ICD-10-CM

## 2024-02-15 DIAGNOSIS — H903 Sensorineural hearing loss, bilateral: Secondary | ICD-10-CM

## 2024-02-15 DIAGNOSIS — H60393 Other infective otitis externa, bilateral: Secondary | ICD-10-CM

## 2024-02-15 MED ORDER — CIPROFLOXACIN-DEXAMETHASONE 0.3-0.1 % OT SUSP: 4.0000 [drp] | Freq: Two times a day (BID) | OTIC | 0 refills | Status: AC

## 2024-02-15 NOTE — Progress Notes (Signed)
 Dear Dr. Philip Aspen, Here is my assessment for our mutual patient, Dustin Hodges. Thank you for allowing me the opportunity to care for your patient. Please do not hesitate to contact me should you have any other questions. Sincerely, Burna Forts PA-C  Otolaryngology Clinic Note Referring provider: Dr. Philip Aspen HPI:  Dustin Hodges is a 73 y.o. male kindly referred by Dr. Philip Aspen   The patient is a 73 year old gentleman presenting today for evaluation of hearing loss.  He notes that over the last month he has had right ear congestion and pressure.  He notes decreased hearing.  He notes that he developed some minimal drainage out of the left ear.  No history of the same, denies any infectious signs or symptoms.  No history of head or neck surgery, no head or neck trauma, no recurrent ear infections as a adult or child.  No history of diabetes.   Independent Review of Additional Tests or Records:  Audiological evaluation on 02/15/2024 Tympanometry: Right ear: Type A- Normal external ear canal volume with normal middle ear pressure and tympanic membrane compliance Left ear: Type A- Normal external ear canal volume with normal middle ear pressure and tympanic membrane compliance     Pure tone Audiometry: Right ear- Normal to mild sensorineural hearing loss from [phone] Hz.   Left ear-  Normal to moderate sensorineural hearing loss from [phone] Hz.     Speech Audiometry: Right ear- Speech Reception Threshold (SRT) was obtained at 20 dBHL. Left ear-Speech Reception Threshold (SRT) was obtained at 25 dBHL.   Word Recognition Score Tested using NU-6 (MLV) Right ear: 100% was obtained at a presentation level of 60 dBHL with contralateral masking which is deemed as  excellent. Left ear: 100% was obtained at a presentation level of 60 dBHL with contralateral masking which is deemed as  excellent.   The hearing test results were completed under headphones and  results are deemed to be of good reliability. Test technique:  conventional    Mild to moderate sensorineural hearing loss  PMH/Meds/All/SocHx/FamHx/ROS:   Past Medical History:  Diagnosis Date   DDD (degenerative disc disease), lumbar    GERD (gastroesophageal reflux disease)    possibly and untreated    Hyperlipidemia    Hypertension      Past Surgical History:  Procedure Laterality Date   COLONOSCOPY  last 2015-2016   [place] colon in [place] 2015- normal    KNEE ARTHROSCOPY     meniscus tear    POLYPECTOMY  2010   benign - pt unsure of lb path on polyp     Family History  Problem Relation Age of Onset   CVA Mother    Heart failure Father    Colon cancer Neg Hx    Colon polyps Neg Hx    Esophageal cancer Neg Hx    Rectal cancer Neg Hx    Stomach cancer Neg Hx      Social Connections: Socially Integrated (01/31/2024)   Social Connection and Isolation Panel [NHANES]    Frequency of Communication with Friends and Family: Twice a week    Frequency of Social Gatherings with Friends and Family: Three times a week    Attends Religious Services: More than 4 times per year    Active Member of Clubs or Organizations: Yes    Attends Banker Meetings: More than 4 times per year    Marital Status: Married      Current Outpatient Medications:    amLODipine (NORVASC)  10 MG tablet, TAKE 1 TABLET(10 MG) BY MOUTH DAILY, Disp: 90 tablet, Rfl: 1   Cholecalciferol (VITAMIN D) 50 MCG (2000 UT) tablet, Take 2,000 Units by mouth daily., Disp: , Rfl:    lisinopril-hydrochlorothiazide (ZESTORETIC) 20-12.5 MG tablet, TAKE 1 TABLET BY MOUTH TWICE DAILY, Disp: 180 tablet, Rfl: 1   pantoprazole (PROTONIX) 40 MG tablet, TAKE 1 TABLET(40 MG) BY MOUTH DAILY, Disp: 90 tablet, Rfl: 1   pravastatin (PRAVACHOL) 40 MG tablet, TAKE 1 TABLET(40 MG) BY MOUTH AT BEDTIME, Disp: 90 tablet, Rfl: 1   Physical Exam:   There were no vitals taken for this visit.  Pertinent Findings  CN II-XII  intact Bilateral external auditory canal meatus with dry flaky skin, external auditory canals with the same with some erythema, no significant drainage, bilateral cerumen impaction Weber 512: equal Rinne 512: AC > BC b/l  Anterior rhinoscopy: Septum midline No lesions of oral cavity/oropharynx; dentition wnl No obviously palpable neck masses/lymphadenopathy/thyromegaly No respiratory distress or stridor  Seprately Identifiable Procedures:  Procedure: Bilateral ear microscopy and cerumen removal using microscope (CPT [id number]) - Mod 50 Pre-procedure diagnosis: bilateral cerumen impaction external auditory canals Post-procedure diagnosis: same Indication: bilateral cerumen impaction; given patient's otologic complaints and history as well as for improved and comprehensive examination of external ear and tympanic membrane, bilateral otologic examination using microscope was performed and impacted cerumen removed  Procedure: Patient was placed semi-recumbent. Both ear canals were examined using the microscope with findings above. Cerumen removed from bilateral external auditory canals using suction and currette with improvement in EAC examination and patency. Left: EAC was patent. TM was intact . Middle ear was aerated. Drainage: none Right: EAC was patent. TM was intact . Middle ear was aerated . Drainage: none Patient tolerated the procedure well.   Impression & Plans:  Dustin Hodges is a 73 y.o. male with the following   Cerumen impaction-  Bilateral cerumen impaction, this was removed without difficulty  Otitis externa-   Patient has bilateral otitis externa, this is not severe, Ciprodex sent in for this I like to see him back in 1 week for reevaluation of this.  Uncertain etiology but he has had rather significant cerumen impaction that could likely be contributing.  No history of immune dysfunction or diabetes.  Hearing loss-  The patient has bilateral symmetric hearing loss mild  to moderate.  His original hearing loss was more severe, once the cerumen was moved he notes dramatic improvement.  I discussed the results of the hearing evaluation with him, at this time he may find some benefit with hearing aids, I will provide him with resources in the area if he decides to move forward.   - f/u 1 week for repeat evaluation of otitis externa    Thank you for allowing me the opportunity to care for your patient. Please do not hesitate to contact me should you have any other questions.  Sincerely, Belma Boxer PA-C [facility] Phone: [phone] Fax: [phone]  02/15/2024, 3:14 PM

## 2024-02-15 NOTE — Progress Notes (Signed)
  [address] [place], [place] [address] [phone]  Audiological Evaluation    Name: Dustin Hodges     DOB:   1951/10/21      MRN:   [id number]                                                                                     Service Date: 02/15/2024     Accompanied by: unaccompanied    Patient comes today after Lorane Rocker, PA-C sent a referral for a hearing evaluation due to concerns with hearing loss.   Symptoms Yes Details  Hearing loss  [x]  Right ear worse- seems to have improved after ear cleaning  Tinnitus  []    Ear pain/ infections/pressure  []    Balance problems  []    Noise exposure history  []    Previous ear surgeries  []    Family history of hearing loss  []    Amplification  []    Other  []  Test completed after Hedges, PA-C cleaned his ears.    Otoscopy: Right ear: Clear external ear canals and notable landmarks visualized on the tympanic membrane. Left ear:  Abnormal eardrum appearance. (Possibly small amount of wax against the eardrum)  Tympanometry: Right ear: Type A- Normal external ear canal volume with normal middle ear pressure and tympanic membrane compliance Left ear: Type A- Normal external ear canal volume with normal middle ear pressure and tympanic membrane compliance   Pure tone Audiometry: Right ear- Normal to mild sensorineural hearing loss from [phone] Hz.   Left ear-  Normal to moderate sensorineural hearing loss from [phone] Hz.    Speech Audiometry: Right ear- Speech Reception Threshold (SRT) was obtained at 20 dBHL. Left ear-Speech Reception Threshold (SRT) was obtained at 25 dBHL.   Word Recognition Score Tested using NU-6 (MLV) Right ear: 100% was obtained at a presentation level of 60 dBHL with contralateral masking which is deemed as  excellent. Left ear: 100% was obtained at a presentation level of 60 dBHL with contralateral masking which is deemed as  excellent.   The hearing test results were  completed under headphones and results are deemed to be of good reliability. Test technique:  conventional     Recommendations: Follow up with ENT as scheduled for today. Repeat audiogram after medical care. Recommend hearing aid consult after medical care/clearance and pending patient motivation.   [name], AUD

## 2024-02-16 ENCOUNTER — Telehealth (INDEPENDENT_AMBULATORY_CARE_PROVIDER_SITE_OTHER): Payer: Self-pay

## 2024-02-16 MED ORDER — NEOMYCIN-POLYMYXIN-HC 1 % OT SOLN: 3.0000 [drp] | Freq: Four times a day (QID) | OTIC | 0 refills | Status: AC

## 2024-02-16 NOTE — Telephone Encounter (Signed)
 Informed patient of alternative drops sent in. Patient verbalizes understanding.

## 2024-02-16 NOTE — Addendum Note (Signed)
 Addended byEmmie Harness, Neel Buffone on: 02/16/2024 04:07 PM   Modules accepted: Orders

## 2024-02-22 ENCOUNTER — Ambulatory Visit (INDEPENDENT_AMBULATORY_CARE_PROVIDER_SITE_OTHER)

## 2024-02-24 ENCOUNTER — Ambulatory Visit (INDEPENDENT_AMBULATORY_CARE_PROVIDER_SITE_OTHER): Admitting: Physician Assistant

## 2024-02-24 VITALS — BP 114/70 | HR 70 | Ht 68.0 in | Wt 172.0 lb

## 2024-02-24 DIAGNOSIS — H6090 Unspecified otitis externa, unspecified ear: Secondary | ICD-10-CM | POA: Diagnosis not present

## 2024-02-24 DIAGNOSIS — H60503 Unspecified acute noninfective otitis externa, bilateral: Secondary | ICD-10-CM

## 2024-02-24 NOTE — Patient Instructions (Signed)
 To help with your buildup of earwax try using sweet oil. This can be purchased over the counter at your local pharmacy. Using a sterilized ear dropper, place a few drops into your ear. Cover the ear with a cotton ball, or warm compress, for 5 to 10 minutes. Rub gently. Wipe out any excess ear wax, and the oil, with a cotton ball or a wet cloth.

## 2024-02-24 NOTE — Progress Notes (Signed)
 .Dear Dr. Zilphia Hilt, Here is my assessment for our mutual patient, Dustin Hodges. Thank you for allowing me the opportunity to care for your patient. Please do not hesitate to contact me should you have any other questions. Sincerely, Belma Boxer PA-C  Otolaryngology Clinic Note Referring provider: Dr. Zilphia Hilt HPI:  Dustin Hodges is a 73 y.o. male kindly referred by Dr. Zilphia Hilt   The patient is a 73 year old gentleman seen our office for follow-up evaluation status post otitis externa and removal of cerumen on 02/15/2024.  The patient did have bilateral otitis externa.  He was placed on Ciprodex .  He notes dramatic improvement with no significant itching, fullness in the ears, no drainage, no pain.  He notes his hearing is very well and at baseline.    Independent Review of Additional Tests or Records:  none   PMH/Meds/All/SocHx/FamHx/ROS:   Past Medical History:  Diagnosis Date   DDD (degenerative disc disease), lumbar    GERD (gastroesophageal reflux disease)    possibly and untreated    Hyperlipidemia    Hypertension      Past Surgical History:  Procedure Laterality Date   COLONOSCOPY  last 2015-2016   last colon in [place] 2015- normal    KNEE ARTHROSCOPY     meniscus tear    POLYPECTOMY  2010   benign - pt unsure of lb path on polyp     Family History  Problem Relation Age of Onset   CVA Mother    Heart failure Father    Colon cancer Neg Hx    Colon polyps Neg Hx    Esophageal cancer Neg Hx    Rectal cancer Neg Hx    Stomach cancer Neg Hx      Social Connections: Socially Integrated (01/31/2024)   Social Connection and Isolation Panel [NHANES]    Frequency of Communication with Friends and Family: Twice a week    Frequency of Social Gatherings with Friends and Family: Three times a week    Attends Religious Services: More than 4 times per year    Active Member of Clubs or Organizations: Yes    Attends Engineer, structural:  More than 4 times per year    Marital Status: Married      Current Outpatient Medications:    amLODipine  (NORVASC ) 10 MG tablet, TAKE 1 TABLET(10 MG) BY MOUTH DAILY, Disp: 90 tablet, Rfl: 1   Cholecalciferol (VITAMIN D) 50 MCG (2000 UT) tablet, Take 2,000 Units by mouth daily., Disp: , Rfl:    lisinopril -hydrochlorothiazide  (ZESTORETIC ) 20-12.5 MG tablet, TAKE 1 TABLET BY MOUTH TWICE DAILY, Disp: 180 tablet, Rfl: 1   pantoprazole  (PROTONIX ) 40 MG tablet, TAKE 1 TABLET(40 MG) BY MOUTH DAILY, Disp: 90 tablet, Rfl: 1   pravastatin  (PRAVACHOL ) 40 MG tablet, TAKE 1 TABLET(40 MG) BY MOUTH AT BEDTIME, Disp: 90 tablet, Rfl: 1   Physical Exam:   BP 114/70 (BP Location: Left Arm, Patient Position: Sitting, Cuff Size: Normal)   Pulse 70   Ht 5\' 8"  (1.727 m)   Wt 172 lb (78 kg)   SpO2 95%   BMI 26.15 kg/m   Pertinent Findings  CN II-XII intact  Bilateral EAC clear and TM intact with well pneumatized middle ear spaces, minimal wax in bilateral external auditory canals, no erythema of the canal, some dry flaky skin Weber 512: equal Rinne 512: AC > BC b/l  No obviously  neck masses/lymphadenopathy/thyromegaly No respiratory distress or stridor  Seprately Identifiable Procedures:  None  Impression &  Plans:  Dustin Hodges is a 73 y.o. male with the following   Otitis externa-  This has completely resolved, he still has some dry skin in his ear.  He does note a history of dandruff, this could likely be seborrheic dermatitis.  He can use his medicated dandruff shampoo on his ears, avoiding any water into the canal.  He may use sweet oil as well for any itching.  If he develops any severe itching, drainage or signs of infection would like to see him back in the office.  We have agreed to to have him follow-up in 6 months for repeat evaluation and then likely 1 year moving forward.  He verbalized understanding and agreement to today's plan had no further questions or concerns.   - f/u 6 months     Thank you for allowing me the opportunity to care for your patient. Please do not hesitate to contact me should you have any other questions.  Sincerely, Belma Boxer PA-C [facility] Phone: [phone] Fax: [phone]  02/24/2024, 8:36 AM

## 2024-02-28 ENCOUNTER — Ambulatory Visit: Payer: Medicare Other | Admitting: Family Medicine

## 2024-03-04 ENCOUNTER — Other Ambulatory Visit: Payer: Self-pay | Admitting: Internal Medicine

## 2024-03-04 DIAGNOSIS — I10 Essential (primary) hypertension: Secondary | ICD-10-CM

## 2024-03-08 ENCOUNTER — Encounter (HOSPITAL_COMMUNITY): Payer: Self-pay

## 2024-03-15 ENCOUNTER — Ambulatory Visit: Admitting: Family Medicine

## 2024-03-15 DIAGNOSIS — Z0000 Encounter for general adult medical examination without abnormal findings: Secondary | ICD-10-CM | POA: Diagnosis not present

## 2024-03-15 NOTE — Patient Instructions (Signed)
 I really enjoyed getting to talk with you today! I am available on Tuesdays and Thursdays for virtual visits if you have any questions or concerns, or if I can be of any further assistance.   CHECKLIST FROM ANNUAL WELLNESS VISIT:  -Follow up (please call to schedule if not scheduled after visit):   -yearly for annual wellness visit with primary care office  Here is a list of your preventive care/health maintenance measures and the plan for each if any are due:  PLAN For any measures below that may be due:   Health Maintenance  Topic Date Due   COVID-19 Vaccine (4 - 2024-25 season) 07/03/2023   INFLUENZA VACCINE  06/01/2024   Medicare Annual Wellness (AWV)  03/15/2025   Pneumonia Vaccine 70+ Years old (3 of 3 - PCV20 or PCV21) 10/12/2026   Colonoscopy  02/13/2027   DTaP/Tdap/Td (3 - Td or Tdap) 03/29/2033   Hepatitis C Screening  Completed   Zoster Vaccines- Shingrix  Completed   HPV VACCINES  Aged Out   Meningococcal B Vaccine  Aged Out    -See a dentist at least yearly  -Get your eyes checked and then per your eye specialist's recommendations  -Other issues addressed today:   -I have included below further information regarding a healthy whole foods based diet, physical activity guidelines for adults, stress management and opportunities for social connections. I hope you find this information useful.   -----------------------------------------------------------------------------------------------------------------------------------------------------------------------------------------------------------------------------------------------------------    NUTRITION: -eat real food: lots of colorful vegetables (half the plate) and fruits -5-7 servings of vegetables and fruits per day (fresh or steamed is best), exp. 2 servings of vegetables with lunch and dinner and 2 servings of fruit per day. Berries and greens such as kale and collards are great choices.  -consume on a  regular basis:  fresh fruits, fresh veggies, fish, nuts, seeds, healthy oils (such as olive oil, avocado oil), whole grains (make sure for bread/pasta/crackers/etc., that the first ingredient on label contains the word "whole"), legumes. -can eat small amounts of dairy and lean meat (no larger than the palm of your hand), but avoid processed meats such as ham, bacon, lunch meat, etc. -drink water -try to avoid fast food and pre-packaged foods, processed meat, ultra processed foods/beverages (donuts, candy, etc.) -most experts advise limiting sodium to < 2300mg  per day, should limit further is any chronic conditions such as high blood pressure, heart disease, diabetes, etc. The American Heart Association advised that < 1500mg  is is ideal -try to avoid foods/beverages that contain any ingredients with names you do not recognize  -try to avoid foods/beverages  with added sugar or sweeteners/sweets  -try to avoid sweet drinks (including diet drinks): soda, juice, Gatorade, sweet tea, power drinks, diet drinks -try to avoid white rice, white bread, pasta (unless whole grain)  EXERCISE GUIDELINES FOR ADULTS: -if you wish to increase your physical activity, do so gradually and with the approval of your doctor -STOP and seek medical care immediately if you have any chest pain, chest discomfort or trouble breathing when starting or increasing exercise  -move and stretch your body, legs, feet and arms when sitting for long periods -Physical activity guidelines for optimal health in adults: -get at least 150 minutes per week of moderate exercise (can talk, but not sing); this is about 20-30 minutes of sustained activity 5-7 days per week or two 10-15 minute episodes of sustained activity 5-7 days per week -do some muscle building/resistance training/strength training at least 2 days per week  -  balance exercises 3+ days per week:   Stand somewhere where you have something sturdy to hold onto if you lose  balance    1) lift up on toes, then back down, start with 5x per day and work up to 20x   2) stand and lift one leg straight out to the side so that foot is a few inches of the floor, start with 5x each side and work up to 20x each side   3) stand on one foot, start with 5 seconds each side and work up to 20 seconds on each side  If you need ideas or help with getting more active:  -Silver sneakers https://tools.silversneakers.com  -Walk with a Doc: http://www.duncan-williams.com/  -try to include resistance (weight lifting/strength building) and balance exercises twice per week: or the following link for ideas: http://castillo-powell.com/  BuyDucts.dk  STRESS MANAGEMENT: -can try meditating, or just sitting quietly with deep breathing while intentionally relaxing all parts of your body for 5 minutes daily -if you need further help with stress, anxiety or depression please follow up with your primary doctor or contact the wonderful folks at [facility]: [phone]  SOCIAL CONNECTIONS: -options in [place] if you wish to engage in more social and exercise related activities:  -Silver sneakers https://tools.silversneakers.com  -Walk with a Doc: http://www.duncan-williams.com/  -Check out the [facility] Active Adults 50+ section on the [place] [employer] (hiking clubs, book clubs, cards and games, chess, exercise classes, aquatic classes and much more) - see the website for details: https://www.[place].gov/departments/parks-recreation/active-adults50  -YouTube has lots of exercise videos for different ages and abilities as well  -[facility] (a variety of indoor and outdoor inperson activities for adults). [phone]. [address]  -Virtual Online Classes (a variety of topics): see seniorplanet.org or call (520)806-6207  -consider volunteering at a  school, hospice center, church, senior center or elsewhere

## 2024-03-15 NOTE — Progress Notes (Signed)
 PATIENT CHECK-IN and HEALTH RISK ASSESSMENT QUESTIONNAIRE:  -completed by phone/video for upcoming Medicare Preventive Visit   Pre-Visit Check-in: 1)Vitals (height, wt, BP, etc) - record in vitals section for visit on day of visit Request home vitals (wt, BP, etc.) and enter into vitals, THEN update Vital Signs SmartPhrase below at the top of the HPI. See below.  2)Review and Update Medications, Allergies PMH, Surgeries, Social history in Epic 3)Hospitalizations in the last year with date/reason? n  4)Review and Update Care Team (patient's specialists) in Epic 5) Complete PHQ9 in Epic  6) Complete Fall Screening in Epic 7)Review all Health Maintenance Due and order under PCP if not done.  Medicare Wellness Patient Questionnaire:  Answer theses question about your habits: How often do you have a drink containing alcohol? Once a month How many drinks containing alcohol do you have on a typical day when you are drinking?1-2 How often do you have six or more drinks on one occasion?never Have you ever smoked?never How many packs a day do/did you smoke? na Do you use smokeless tobacco?n Do you use an illicit drugs?n On average, how many days per week do you engage in moderate to strenuous exercise (like a brisk walk)? 3 days per week for 30 minutes, yoga and walks about 2-3 times per week for 45-60 minutes Typical breakfast: smoothier (fruit, milk, yogurt) or bagel with fried egg, whole grain oats Typical lunch: hummus, veggies, bagels, crackers, apple or orange Typical dinner: chicken or salmon, salad or veggies, or stir fry - rarely eats red meat Typical snacks: sherbet  Beverages: water, coffee, green tea Social connections: good, greater than 4 days per week  Answer theses question about your everyday activities: Can you perform most household chores?y Are you deaf or have significant trouble hearing?n - better since removal of ear wax Do you feel that you have a problem with  memory? no Do you feel safe at home?y Last dentist visit? Goes on a regular basis 8. Do you have any difficulty performing your everyday activities?n Are you having any difficulty walking, taking medications on your own, and or difficulty managing daily home needs?n Do you have difficulty walking or climbing stairs?n Do you have difficulty dressing or bathing?n Do you have difficulty doing errands alone such as visiting a doctor's office or shopping?n Do you currently have any difficulty preparing food and eating?n Do you currently have any difficulty using the toilet?n Do you have any difficulty managing your finances?n Do you have any difficulties with housekeeping of managing your housekeeping?n   Do you have Advanced Directives in place (Living Will, Healthcare Power or Attorney)? Has both   Last eye Exam and location? Goes yearly   Do you currently use prescribed or non-prescribed narcotic or opioid pain medications? no  Do you have a history or close family history of breast, ovarian, tubal or peritoneal cancer or a family member with BRCA (breast cancer susceptibility 1 and 2) gene mutations? Mother had melanoma, brother had kidney cancer - was told did not have genetic link    ----------------------------------------------------------------------------------------------------------------------------------------------------------------------------------------------------------------------  Because this visit was a virtual/telehealth visit, some criteria may be missing or patient reported. Any vitals not documented were not able to be obtained and vitals that have been documented are patient reported.    MEDICARE ANNUAL PREVENTIVE CARE VISIT WITH PROVIDER (Welcome to Medicare, initial annual wellness or annual wellness exam)  Virtual Visit via Video Note  I connected with Dustin Hodges on 03/15/24  by a video  enabled telemedicine application and verified that I  am speaking with the correct person using two identifiers.  Location patient: home Location provider:work or home office Persons participating in the virtual visit: patient, provider  Concerns and/or follow up today: doing ok, saw ent for some ear wax build up.    See HM section in Epic for other details of completed HM.    ROS: negative for report of fevers, unintentional weight loss, vision changes, vision loss, hearing loss or change, chest pain, sob, hemoptysis, melena, hematochezia, hematuria, falls, bleeding or bruising, thoughts of suicide or self harm, memory loss  Patient-completed extensive health risk assessment - reviewed and discussed with the patient: See Health Risk Assessment completed with patient prior to the visit either above or in recent phone note. This was reviewed in detailed with the patient today and appropriate recommendations, orders and referrals were placed as needed per Summary below and patient instructions.   Review of Medical History: -PMH, PSH, Family History and current specialty and care providers reviewed and updated and listed below   Patient Care Team: Zilphia Hilt, Charyl Coppersmith, MD as PCP - General (Internal Medicine) Devon Fogo, MD (Inactive) as Consulting Physician (Dermatology)   Past Medical History:  Diagnosis Date   DDD (degenerative disc disease), lumbar    GERD (gastroesophageal reflux disease)    possibly and untreated    Hyperlipidemia    Hypertension     Past Surgical History:  Procedure Laterality Date   COLONOSCOPY  last 2015-2016   last colon in [place] 2015- normal    KNEE ARTHROSCOPY     meniscus tear    POLYPECTOMY  2010   benign - pt unsure of lb path on polyp     Social History   Socioeconomic History   Marital status: Married    Spouse name: Not on file   Number of children: Not on file   Years of education: Not on file   Highest education level: Professional school degree (e.g., MD, DDS, DVM, JD)   Occupational History   Not on file  Tobacco Use   Smoking status: Never   Smokeless tobacco: Never  Vaping Use   Vaping status: Never Used  Substance and Sexual Activity   Alcohol use: Yes    Comment: occasional   Drug use: Never   Sexual activity: Not on file  Other Topics Concern   Not on file  Social History Narrative   Not on file   Social Drivers of Health   Financial Resource Strain: Low Risk  (01/31/2024)   Overall Financial Resource Strain (CARDIA)    Difficulty of Paying Living Expenses: Not hard at all  Food Insecurity: No Food Insecurity (01/31/2024)   Hunger Vital Sign    Worried About Running Out of Food in the Last Year: Never true    Ran Out of Food in the Last Year: Never true  Transportation Needs: No Transportation Needs (01/31/2024)   PRAPARE - Administrator, Civil Service (Medical): No    Lack of Transportation (Non-Medical): No  Physical Activity: Insufficiently Active (01/31/2024)   Exercise Vital Sign    Days of Exercise per Week: 3 days    Minutes of Exercise per Session: 30 min  Stress: Stress Concern Present (01/31/2024)   [employer] of Occupational Health - Occupational Stress Questionnaire    Feeling of Stress : To some extent  Social Connections: Socially Integrated (01/31/2024)   Social Connection and Isolation Panel [NHANES]    Frequency  of Communication with Friends and Family: Twice a week    Frequency of Social Gatherings with Friends and Family: Three times a week    Attends Religious Services: More than 4 times per year    Active Member of Clubs or Organizations: Yes    Attends Banker Meetings: More than 4 times per year    Marital Status: Married  Catering manager Violence: Not At Risk (03/14/2023)   Humiliation, Afraid, Rape, and Kick questionnaire    Fear of Current or Ex-Partner: No    Emotionally Abused: No    Physically Abused: No    Sexually Abused: No    Family History  Problem Relation Age of  Onset   CVA Mother    Heart failure Father    Colon cancer Neg Hx    Colon polyps Neg Hx    Esophageal cancer Neg Hx    Rectal cancer Neg Hx    Stomach cancer Neg Hx     Current Outpatient Medications on File Prior to Visit  Medication Sig Dispense Refill   amLODipine  (NORVASC ) 10 MG tablet TAKE 1 TABLET(10 MG) BY MOUTH DAILY 90 tablet 0   Cholecalciferol (VITAMIN D) 50 MCG (2000 UT) tablet Take 2,000 Units by mouth daily.     lisinopril -hydrochlorothiazide  (ZESTORETIC ) 20-12.5 MG tablet TAKE 1 TABLET BY MOUTH TWICE DAILY 180 tablet 1   pantoprazole  (PROTONIX ) 40 MG tablet TAKE 1 TABLET(40 MG) BY MOUTH DAILY 90 tablet 1   pravastatin  (PRAVACHOL ) 40 MG tablet TAKE 1 TABLET(40 MG) BY MOUTH AT BEDTIME 90 tablet 1   No current facility-administered medications on file prior to visit.    No Known Allergies     Physical Exam Vitals requested from patient and listed below if patient had equipment and was able to obtain at home for this virtual visit: There were no vitals filed for this visit. Estimated body mass index is 26.15 kg/m as calculated from the following:   Height as of 02/24/24: 5\' 8"  (1.727 m).   Weight as of 02/24/24: 172 lb (78 kg).  EKG (optional): deferred due to virtual visit  GENERAL: alert, oriented, no acute distress detected; full vision exam deferred due to pandemic and/or virtual encounter  HEENT: atraumatic, conjunttiva clear, no obvious abnormalities on inspection of external nose and ears  NECK: normal movements of the head and neck  LUNGS: on inspection no signs of respiratory distress, breathing rate appears normal, no obvious gross SOB, gasping or wheezing  CV: no obvious cyanosis  MS: moves all visible extremities without noticeable abnormality  PSYCH/NEURO: pleasant and cooperative, no obvious depression or anxiety, speech and thought processing grossly intact, Cognitive function grossly intact  Flowsheet Row Office Visit from 03/09/2022 in [facility]  PHQ-9 Total Score 0           03/15/2024   12:04 PM 03/14/2023    4:41 PM 03/09/2022    9:06 AM 03/12/2021    1:01 PM 12/13/2019    8:24 AM  Depression screen PHQ 2/9  Decreased Interest 0 0 0 0 0  Down, Depressed, Hopeless 0 0 0 0 0  PHQ - 2 Score 0 0 0 0 0  Altered sleeping   0 0 0  Tired, decreased energy   0 0 0  Change in appetite   0 0 0  Feeling bad or failure about yourself    0 0 0  Trouble concentrating   0 0 0  Moving slowly  or fidgety/restless   0 0 0  Suicidal thoughts   0 0 0  PHQ-9 Score   0 0 0  Difficult doing work/chores   Not difficult at all Not difficult at all Not difficult at all       03/09/2022    9:06 AM 03/14/2023    4:36 PM 03/15/2023    8:35 AM 03/15/2024   12:03 PM 03/15/2024   12:04 PM  Fall Risk  Falls in the past year? 0 0 0 0 0  Was there an injury with Fall? 0 0 0 0 0  Fall Risk Category Calculator 0 0 0 0 0  Fall Risk Category (Retired) Low      (RETIRED) Patient Fall Risk Level Low fall risk      Patient at Risk for Falls Due to No Fall Risks      Fall risk Follow up Falls evaluation completed Falls evaluation completed Falls evaluation completed  Falls evaluation completed;Education provided     SUMMARY AND PLAN:  Medicare annual wellness visit, subsequent  Discussed applicable health maintenance/preventive health measures and advised and referred or ordered per patient preferences:  Health Maintenance  Topic Date Due   COVID-19 Vaccine (4 - 2024-25 season) 07/03/2023   INFLUENZA VACCINE  06/01/2024   Medicare Annual Wellness (AWV)  03/15/2025   Pneumonia Vaccine 53+ Years old (3 of 3 - PCV20 or PCV21) 10/12/2026   Colonoscopy  02/13/2027   DTaP/Tdap/Td (3 - Td or Tdap) 03/29/2033   Hepatitis C Screening  Completed   Zoster Vaccines- Shingrix  Completed   HPV VACCINES  Aged Out   Meningococcal B Vaccine  Aged Out     Education and counseling on the following was provided based on the  above review of health and a plan/checklist for the patient, along with additional information discussed, was provided for the patient in the patient instructions :   -Advised and counseled on a healthy lifestyle - including the importance of a healthy diet, regular physical activity, social connections -Reviewed patient's current diet. Advised and counseled on a whole foods based healthy diet. A summary of a healthy diet was provided in the Patient Instructions.  -reviewed patient's current physical activity level and discussed exercise guidelines for adults. Discussed community resources and ideas for safe exercise at home to assist in meeting exercise guideline recommendations in a safe and healthy way. Discussed possibly adding more strength training - [facility]. -discussed Vit D supplementation risks/benefits, advised current guidelines advise maintaining level between 20-30 -Advise yearly dental visits at minimum and regular eye exams -Advised and counseled on alcohol safe limits  Follow up: see patient instructions   Patient Instructions  I really enjoyed getting to talk with you today! I am available on Tuesdays and Thursdays for virtual visits if you have any questions or concerns, or if I can be of any further assistance.   CHECKLIST FROM ANNUAL WELLNESS VISIT:  -Follow up (please call to schedule if not scheduled after visit):   -yearly for annual wellness visit with primary care office  Here is a list of your preventive care/health maintenance measures and the plan for each if any are due:  PLAN For any measures below that may be due:   Health Maintenance  Topic Date Due   COVID-19 Vaccine (4 - 2024-25 season) 07/03/2023   INFLUENZA VACCINE  06/01/2024   Medicare Annual Wellness (AWV)  03/15/2025   Pneumonia Vaccine  58+ Years old (3 of 3 - PCV20 or PCV21) 10/12/2026   Colonoscopy  02/13/2027   DTaP/Tdap/Td (3 - Td or Tdap)  03/29/2033   Hepatitis C Screening  Completed   Zoster Vaccines- Shingrix  Completed   HPV VACCINES  Aged Out   Meningococcal B Vaccine  Aged Out    -See a dentist at least yearly  -Get your eyes checked and then per your eye specialist's recommendations  -Other issues addressed today:   -I have included below further information regarding a healthy whole foods based diet, physical activity guidelines for adults, stress management and opportunities for social connections. I hope you find this information useful.   -----------------------------------------------------------------------------------------------------------------------------------------------------------------------------------------------------------------------------------------------------------    NUTRITION: -eat real food: lots of colorful vegetables (half the plate) and fruits -5-7 servings of vegetables and fruits per day (fresh or steamed is best), exp. 2 servings of vegetables with lunch and dinner and 2 servings of fruit per day. Berries and greens such as kale and collards are great choices.  -consume on a regular basis:  fresh fruits, fresh veggies, fish, nuts, seeds, healthy oils (such as olive oil, avocado oil), whole grains (make sure for bread/pasta/crackers/etc., that the first ingredient on label contains the word "whole"), legumes. -can eat small amounts of dairy and lean meat (no larger than the palm of your hand), but avoid processed meats such as ham, bacon, lunch meat, etc. -drink water -try to avoid fast food and pre-packaged foods, processed meat, ultra processed foods/beverages (donuts, candy, etc.) -most experts advise limiting sodium to < 2300mg  per day, should limit further is any chronic conditions such as high blood pressure, heart disease, diabetes, etc. The American Heart Association advised that < 1500mg  is is ideal -try to avoid foods/beverages that contain any ingredients with names you do  not recognize  -try to avoid foods/beverages  with added sugar or sweeteners/sweets  -try to avoid sweet drinks (including diet drinks): soda, juice, Gatorade, sweet tea, power drinks, diet drinks -try to avoid white rice, white bread, pasta (unless whole grain)  EXERCISE GUIDELINES FOR ADULTS: -if you wish to increase your physical activity, do so gradually and with the approval of your doctor -STOP and seek medical care immediately if you have any chest pain, chest discomfort or trouble breathing when starting or increasing exercise  -move and stretch your body, legs, feet and arms when sitting for long periods -Physical activity guidelines for optimal health in adults: -get at least 150 minutes per week of moderate exercise (can talk, but not sing); this is about 20-30 minutes of sustained activity 5-7 days per week or two 10-15 minute episodes of sustained activity 5-7 days per week -do some muscle building/resistance training/strength training at least 2 days per week  -balance exercises 3+ days per week:   Stand somewhere where you have something sturdy to hold onto if you lose balance    1) lift up on toes, then back down, start with 5x per day and work up to 20x   2) stand and lift one leg straight out to the side so that foot is a few inches of the floor, start with 5x each side and work up to 20x each side   3) stand on one foot, start with 5 seconds each side and work up to 20 seconds on each side  If you need ideas or help with getting more active:  -Silver sneakers https://tools.silversneakers.com  -Walk with a Doc: http://www.duncan-williams.com/  -try to include resistance (weight lifting/strength building)  and balance exercises twice per week: or the following link for ideas: http://castillo-powell.com/  BuyDucts.dk  STRESS MANAGEMENT: -can try meditating, or just sitting quietly with  deep breathing while intentionally relaxing all parts of your body for 5 minutes daily -if you need further help with stress, anxiety or depression please follow up with your primary doctor or contact the wonderful folks at [facility]: [phone]  SOCIAL CONNECTIONS: -options in [place] if you wish to engage in more social and exercise related activities:  -Silver sneakers https://tools.silversneakers.com  -Walk with a Doc: http://www.duncan-williams.com/  -Check out the [facility] Active Adults 50+ section on the [place] of [employer] (hiking clubs, book clubs, cards and games, chess, exercise classes, aquatic classes and much more) - see the website for details: https://www.[place].gov/departments/parks-recreation/active-adults50  -YouTube has lots of exercise videos for different ages and abilities as well  -[facility] (a variety of indoor and outdoor inperson activities for adults). [phone]. [address]  -Virtual Online Classes (a variety of topics): see seniorplanet.org or call [phone]  -consider volunteering at a school, hospice center, church, senior center or elsewhere            Maurie Southern, DO

## 2024-03-20 ENCOUNTER — Ambulatory Visit: Payer: Self-pay | Admitting: Internal Medicine

## 2024-03-20 ENCOUNTER — Ambulatory Visit (INDEPENDENT_AMBULATORY_CARE_PROVIDER_SITE_OTHER): Payer: Medicare Other | Admitting: Internal Medicine

## 2024-03-20 ENCOUNTER — Encounter: Payer: Self-pay | Admitting: Internal Medicine

## 2024-03-20 VITALS — BP 130/68 | HR 64 | Temp 97.9°F | Ht 68.0 in | Wt 173.3 lb

## 2024-03-20 DIAGNOSIS — I10 Essential (primary) hypertension: Secondary | ICD-10-CM

## 2024-03-20 DIAGNOSIS — N182 Chronic kidney disease, stage 2 (mild): Secondary | ICD-10-CM | POA: Diagnosis not present

## 2024-03-20 DIAGNOSIS — E785 Hyperlipidemia, unspecified: Secondary | ICD-10-CM | POA: Diagnosis not present

## 2024-03-20 DIAGNOSIS — Z0000 Encounter for general adult medical examination without abnormal findings: Secondary | ICD-10-CM | POA: Diagnosis not present

## 2024-03-20 DIAGNOSIS — Z125 Encounter for screening for malignant neoplasm of prostate: Secondary | ICD-10-CM

## 2024-03-20 LAB — CBC WITH DIFFERENTIAL/PLATELET
Basophils Absolute: 0 10*3/uL (ref 0.0–0.1)
Basophils Relative: 0.4 % (ref 0.0–3.0)
Eosinophils Absolute: 0.2 10*3/uL (ref 0.0–0.7)
Eosinophils Relative: 2.8 % (ref 0.0–5.0)
HCT: 46.1 % (ref 39.0–52.0)
Hemoglobin: 16.1 g/dL (ref 13.0–17.0)
Lymphocytes Relative: 15.8 % (ref 12.0–46.0)
Lymphs Abs: 1.2 10*3/uL (ref 0.7–4.0)
MCHC: 34.9 g/dL (ref 30.0–36.0)
MCV: 85.9 fl (ref 78.0–100.0)
Monocytes Absolute: 0.8 10*3/uL (ref 0.1–1.0)
Monocytes Relative: 10.6 % (ref 3.0–12.0)
Neutro Abs: 5.2 10*3/uL (ref 1.4–7.7)
Neutrophils Relative %: 70.4 % (ref 43.0–77.0)
Platelets: 217 10*3/uL (ref 150.0–400.0)
RBC: 5.37 Mil/uL (ref 4.22–5.81)
RDW: 13.4 % (ref 11.5–15.5)
WBC: 7.4 10*3/uL (ref 4.0–10.5)

## 2024-03-20 LAB — LIPID PANEL
Cholesterol: 158 mg/dL (ref 0–200)
HDL: 42.6 mg/dL (ref >39.00)
LDL Cholesterol: 98 mg/dL (ref 0–99)
NonHDL: 115.1
Total CHOL/HDL Ratio: 4
Triglycerides: 87 mg/dL (ref 0.0–149.0)
VLDL: 17.4 mg/dL (ref 0.0–40.0)

## 2024-03-20 LAB — COMPREHENSIVE METABOLIC PANEL WITH GFR
ALT: 15 U/L (ref 0–53)
AST: 18 U/L (ref 0–37)
Albumin: 4.3 g/dL (ref 3.5–5.2)
Alkaline Phosphatase: 55 U/L (ref 39–117)
BUN: 21 mg/dL (ref 6–23)
CO2: 23 meq/L (ref 19–32)
Calcium: 9.3 mg/dL (ref 8.4–10.5)
Chloride: 98 meq/L (ref 96–112)
Creatinine, Ser: 0.99 mg/dL (ref 0.40–1.50)
GFR: 77.17 mL/min (ref >60.00)
Glucose, Bld: 100 mg/dL — ABNORMAL HIGH (ref 70–99)
Potassium: 3.9 meq/L (ref 3.5–5.1)
Sodium: 132 meq/L — ABNORMAL LOW (ref 135–145)
Total Bilirubin: 1 mg/dL (ref 0.2–1.2)
Total Protein: 7.1 g/dL (ref 6.0–8.3)

## 2024-03-20 LAB — VITAMIN D 25 HYDROXY (VIT D DEFICIENCY, FRACTURES): VITD: 41.09 ng/mL (ref 30.00–100.00)

## 2024-03-20 LAB — VITAMIN B12: Vitamin B-12: 318 pg/mL (ref 211–911)

## 2024-03-20 LAB — TSH: TSH: 2.48 u[IU]/mL (ref 0.35–5.50)

## 2024-03-20 NOTE — Progress Notes (Signed)
 Established Patient Office Visit     CC/Reason for Visit: Annual preventive exam  HPI: Dustin Hodges is a 73 y.o. male who is coming in today for the above mentioned reasons. Past Medical History is significant for: Hypertension, hyperlipidemia, chronic kidney disease stage II, GERD.  He is feeling well.  Has noted some mild issues with memory such as forgetting what he had for breakfast a particular morning.  Never with major cognitive issues.  Family members have not noticed any issues.  Immunizations are up-to-date.  Colonoscopy in 2021 and is a 7-year callback.   Past Medical/Surgical History: Past Medical History:  Diagnosis Date   DDD (degenerative disc disease), lumbar    GERD (gastroesophageal reflux disease)    possibly and untreated    Hyperlipidemia    Hypertension     Past Surgical History:  Procedure Laterality Date   COLONOSCOPY  last 2015-2016   last colon in [place] 2015- normal    KNEE ARTHROSCOPY     meniscus tear    POLYPECTOMY  2010   benign - pt unsure of lb path on polyp     Social History:  reports that he has never smoked. He has never used smokeless tobacco. He reports current alcohol use. He reports that he does not use drugs.  Allergies: No Known Allergies  Family History:  Family History  Problem Relation Age of Onset   CVA Mother    Heart failure Father    Colon cancer Neg Hx    Colon polyps Neg Hx    Esophageal cancer Neg Hx    Rectal cancer Neg Hx    Stomach cancer Neg Hx      Current Outpatient Medications:    amLODipine  (NORVASC ) 10 MG tablet, TAKE 1 TABLET(10 MG) BY MOUTH DAILY, Disp: 90 tablet, Rfl: 0   Cholecalciferol (VITAMIN D) 50 MCG (2000 UT) tablet, Take 2,000 Units by mouth daily., Disp: , Rfl:    lisinopril -hydrochlorothiazide  (ZESTORETIC ) 20-12.5 MG tablet, TAKE 1 TABLET BY MOUTH TWICE DAILY, Disp: 180 tablet, Rfl: 1   pantoprazole  (PROTONIX ) 40 MG tablet, TAKE 1 TABLET(40 MG) BY MOUTH DAILY, Disp: 90  tablet, Rfl: 1   pravastatin  (PRAVACHOL ) 40 MG tablet, TAKE 1 TABLET(40 MG) BY MOUTH AT BEDTIME, Disp: 90 tablet, Rfl: 1  Review of Systems:  Negative unless indicated in HPI.   Physical Exam: Vitals:   03/20/24 0855  BP: 130/68  Pulse: 64  Temp: 97.9 F (36.6 C)  TempSrc: Oral  SpO2: 96%  Weight: 173 lb 4.8 oz (78.6 kg)  Height: 5\' 8"  (1.727 m)    Body mass index is 26.35 kg/m.   Physical Exam   Impression and Plan:  Encounter for preventive health examination -     PSA; Future  Hyperlipidemia, unspecified hyperlipidemia type -     Lipid panel; Future  Essential hypertension -     CBC with Differential/Platelet; Future -     Comprehensive metabolic panel with GFR; Future  CKD (chronic kidney disease) stage 2, GFR 60-89 ml/min -     TSH; Future -     Vitamin B12; Future -     VITAMIN D 25 Hydroxy (Vit-D Deficiency, Fractures); Future  Prostate cancer screening  Primary hypertension    -Recommend routine eye and dental care. -Healthy lifestyle discussed in detail. -Labs to be updated today. -Prostate cancer screening: PSA today Health Maintenance  Topic Date Due   COVID-19 Vaccine (4 - 2024-25 season) 07/03/2023   Flu  Shot  06/01/2024   Medicare Annual Wellness Visit  03/15/2025   Pneumonia Vaccine (3 of 3 - PCV20 or PCV21) 10/12/2026   Colon Cancer Screening  02/13/2027   DTaP/Tdap/Td vaccine (3 - Td or Tdap) 03/29/2033   Hepatitis C Screening  Completed   Zoster (Shingles) Vaccine  Completed   HPV Vaccine  Aged Out   Meningitis B Vaccine  Aged Out         Dustin Irizarry Zilphia Hilt, MD [facility]

## 2024-04-02 DIAGNOSIS — H25813 Combined forms of age-related cataract, bilateral: Secondary | ICD-10-CM | POA: Diagnosis not present

## 2024-05-22 NOTE — Progress Notes (Signed)
   05/22/2024  Patient ID: Dustin Hodges, male   DOB: 1951-07-12, 73 y.o.   MRN: [id number]  Pharmacy Quality Measure Review  This patient is appearing on a report for being at risk of failing the adherence measure for cholesterol (statin) medications this calendar year.   Medication: Pravastatin  40mg  Last fill date: 04/06/24 for 90 day supply  Insurance report was not up to date. No action needed at this time.   [name], PharmD Clinical Pharmacist [phone]

## 2024-06-04 ENCOUNTER — Other Ambulatory Visit: Payer: Self-pay | Admitting: Internal Medicine

## 2024-06-04 DIAGNOSIS — I10 Essential (primary) hypertension: Secondary | ICD-10-CM

## 2024-07-01 ENCOUNTER — Other Ambulatory Visit: Payer: Self-pay | Admitting: Internal Medicine

## 2024-07-01 DIAGNOSIS — I10 Essential (primary) hypertension: Secondary | ICD-10-CM

## 2024-07-11 DIAGNOSIS — L814 Other melanin hyperpigmentation: Secondary | ICD-10-CM | POA: Diagnosis not present

## 2024-07-11 DIAGNOSIS — L218 Other seborrheic dermatitis: Secondary | ICD-10-CM | POA: Diagnosis not present

## 2024-07-11 DIAGNOSIS — L570 Actinic keratosis: Secondary | ICD-10-CM | POA: Diagnosis not present

## 2024-07-11 DIAGNOSIS — L820 Inflamed seborrheic keratosis: Secondary | ICD-10-CM | POA: Diagnosis not present

## 2024-07-11 DIAGNOSIS — L720 Epidermal cyst: Secondary | ICD-10-CM | POA: Diagnosis not present

## 2024-07-11 DIAGNOSIS — D225 Melanocytic nevi of trunk: Secondary | ICD-10-CM | POA: Diagnosis not present

## 2024-08-24 ENCOUNTER — Encounter (INDEPENDENT_AMBULATORY_CARE_PROVIDER_SITE_OTHER): Payer: Self-pay | Admitting: Physician Assistant

## 2024-08-24 ENCOUNTER — Ambulatory Visit (INDEPENDENT_AMBULATORY_CARE_PROVIDER_SITE_OTHER): Admitting: Physician Assistant

## 2024-08-24 VITALS — BP 136/83 | HR 64 | Ht 68.0 in | Wt 168.0 lb

## 2024-08-24 DIAGNOSIS — L309 Dermatitis, unspecified: Secondary | ICD-10-CM | POA: Diagnosis not present

## 2024-08-24 DIAGNOSIS — H608X3 Other otitis externa, bilateral: Secondary | ICD-10-CM

## 2024-08-24 MED ORDER — HYDROCORTISONE 1 % EX CREA: 1.0000 | TOPICAL_CREAM | Freq: Two times a day (BID) | CUTANEOUS | 0 refills | Status: AC

## 2024-08-24 NOTE — Progress Notes (Signed)
 Patient is having BP managed. Patient took medication about an hour ago.

## 2024-08-24 NOTE — Progress Notes (Signed)
 Dear Dr. Theophilus Andrews, Here is my assessment for our mutual patient, Dustin Hodges. Thank you for allowing me the opportunity to care for your patient. Please do not hesitate to contact me should you have any other questions. Sincerely, Dustin Cohen PA-C  Otolaryngology Clinic Note Referring provider: Dr. Theophilus Andrews HPI:  Dustin Hodges is a 73 y.o. male kindly referred by Dr. Theophilus Andrews   The patient is a 73 year old gentleman seen in our office for follow-up evaluation of otitis externa.  The patient was last seen in the office on 02/24/2024.  Recap, the patient was originally seen on 02/15/2024, he had otitis externa.  He was placed on Ciprodex , he had dramatic improvement in his symptoms.  Since that time he notes he has been doing well, he notes that he has had some itchiness of the ears and has been using all of oil-based oil on the ears which seems to have managed his symptoms.  He denies any pain in the ears, he denies any purulent drainage, no changes to his hearing.   Independent Review of Additional Tests or Records:  None   PMH/Meds/All/SocHx/FamHx/ROS:   Past Medical History:  Diagnosis Date   DDD (degenerative disc disease), lumbar    GERD (gastroesophageal reflux disease)    possibly and untreated    Hyperlipidemia    Hypertension      Past Surgical History:  Procedure Laterality Date   COLONOSCOPY  last 2015-2016   last colon in [place] 2015- normal    KNEE ARTHROSCOPY     meniscus tear    POLYPECTOMY  2010   benign - pt unsure of lb path on polyp     Family History  Problem Relation Age of Onset   CVA Mother    Heart failure Father    Colon cancer Neg Hx    Colon polyps Neg Hx    Esophageal cancer Neg Hx    Rectal cancer Neg Hx    Stomach cancer Neg Hx      Social Connections: Socially Integrated (01/31/2024)   Social Connection and Isolation Panel    Frequency of Communication with Friends and Family: Twice a week    Frequency of  Social Gatherings with Friends and Family: Three times a week    Attends Religious Services: More than 4 times per year    Active Member of Clubs or Organizations: Yes    Attends Engineer, structural: More than 4 times per year    Marital Status: Married      Current Outpatient Medications:    amLODipine  (NORVASC ) 10 MG tablet, TAKE 1 TABLET(10 MG) BY MOUTH DAILY, Disp: 90 tablet, Rfl: 1   Cholecalciferol (VITAMIN D ) 50 MCG (2000 UT) tablet, Take 2,000 Units by mouth daily., Disp: , Rfl:    lisinopril -hydrochlorothiazide  (ZESTORETIC ) 20-12.5 MG tablet, TAKE 1 TABLET BY MOUTH TWICE DAILY, Disp: 180 tablet, Rfl: 1   pantoprazole  (PROTONIX ) 40 MG tablet, TAKE 1 TABLET(40 MG) BY MOUTH DAILY, Disp: 90 tablet, Rfl: 1   pravastatin  (PRAVACHOL ) 40 MG tablet, TAKE 1 TABLET(40 MG) BY MOUTH AT BEDTIME, Disp: 90 tablet, Rfl: 1   Physical Exam:   BP 136/83   Pulse 64   Ht 5' 8 (1.727 m)   Wt 168 lb (76.2 kg)   SpO2 95%   BMI 25.54 kg/m   Pertinent Findings  CN II-XII intact Bilateral EAC clear and TM intact with well pneumatized middle ear spaces, minimal wax in the bilateral external auditory canals, some dry  flaking skin at the bilateral concha No lesions of oral cavity/oropharynx; dentition within normal limits No obviously palpable neck masses/lymphadenopathy/thyromegaly No respiratory distress or stridor  Seprately Identifiable Procedures:  None  Impression & Plans:  Dustin Hodges is a 73 y.o. male with the following   Otitis externa-  No return of acute otitis externa.  He does have some minimal dermatitis and dryness of the bilateral ears.  He notes improvement with the olive oil based treatment.  I would also recommend using hydrocortisone as needed for any flares, if he has any worsening signs or symptoms I like to see him back in the office.  The patient verbalized understanding and agreement to today's plan had no further questions or concerns.   - f/u  PRN   Thank you for allowing me the opportunity to care for your patient. Please do not hesitate to contact me should you have any other questions.  Sincerely, Dustin Cohen PA-C [facility] Phone: [phone] Fax: [phone]  08/24/2024, 9:15 AM

## 2024-08-30 ENCOUNTER — Other Ambulatory Visit: Payer: Self-pay | Admitting: Internal Medicine

## 2024-08-30 DIAGNOSIS — K219 Gastro-esophageal reflux disease without esophagitis: Secondary | ICD-10-CM

## 2024-10-03 ENCOUNTER — Other Ambulatory Visit: Payer: Self-pay | Admitting: Internal Medicine

## 2024-10-03 DIAGNOSIS — E785 Hyperlipidemia, unspecified: Secondary | ICD-10-CM

## 2024-10-18 ENCOUNTER — Other Ambulatory Visit: Payer: Self-pay | Admitting: Internal Medicine

## 2024-10-18 DIAGNOSIS — I10 Essential (primary) hypertension: Secondary | ICD-10-CM

## 2024-11-06 ENCOUNTER — Ambulatory Visit: Payer: Self-pay

## 2024-11-06 ENCOUNTER — Telehealth: Payer: Self-pay

## 2024-11-06 NOTE — Telephone Encounter (Signed)
 Noted

## 2024-11-06 NOTE — Telephone Encounter (Signed)
 Copied from CRM [id number]. Topic: Clinical - Red Word Triage >> Nov 06, 2024 11:39 AM Macario HERO wrote: Red Word that prompted transfer to Nurse Triage: Patient called said he's been experiencing diarrhea frequently every hour, no appetite and not eating. >> Nov 06, 2024  2:36 PM Robinson H wrote: Patient wants to speak with Megan to give updated information, didn't want to give information to agent, patient stated he wanted to speak with Duwaine Carrier [phone]

## 2024-11-06 NOTE — Telephone Encounter (Signed)
 FYI Only or Action Required?: FYI only for provider: appointment scheduled on 11/07/24 via virtual UC.  Patient was last seen in primary care on 03/20/2024 by Theophilus Hodges, Tully GRADE, MD.  Called Nurse Triage reporting Diarrhea.  Symptoms began several days ago.  Interventions attempted: OTC medications: imodium and Rest, hydration, or home remedies.  Symptoms are: unchanged.  Triage Disposition: See Physician Within 24 Hours  Patient/caregiver understands and will follow disposition?: Yes     Copied from CRM [id number]. Topic: Clinical - Red Word Triage >> Nov 06, 2024 11:39 AM Macario HERO wrote: Red Word that prompted transfer to Nurse Triage: Patient called said he's been experiencing diarrhea frequently every hour, no appetite and not eating.    Reason for Disposition  [1] MODERATE diarrhea (e.g., 4-6 times / day more than normal) AND [2] age > 70 years  Answer Assessment - Initial Assessment Questions Pt called in to report diarrhea since Saturday, 01/03. Pt states that he suspects he is having BM 12x a day; no blood or mucous in stool. Reports minimal abdominal cramping. Pt reports stool was watery brown but as he is unable to keep food in, amount has decreased. Pt is drinking fluids, Gatorade mixed with electrolyte packet for salt intake. Pt is able to eat toast and crackers at this time. Reports he is taking things slow and not standing up too quickly d/t some fogginess and lightheadedness. Pt denies falling or feeling faint. Pt denies any respiratory symptoms but states he did feel that he was running a low grade fever. Pt has been taking imodium for symptoms. Pt did travel at [place] but no one else is experiencing diarrhea symptoms; no abx in Dustin past 2 months. Discussed updating PCP and virtual UC appt d/t availability. Appointment scheduled for evaluation. Patient agrees with plan of care, and will call back if anything changes, or if symptoms worsen.       1. DIARRHEA  SEVERITY: How bad is Dustin diarrhea? How many more stools have you had in Dustin past 24 hours than normal?      Several pt suspects 12x a day   2. ONSET: When did Dustin diarrhea begin?      Saturday, 1/03  3. STOOL DESCRIPTION:  How loose or watery is Dustin diarrhea? What is Dustin stool color? Is there any blood or mucous in Dustin stool?     Watery, brown; no blood and mucous   4. VOMITING: Are you also vomiting? If Yes, ask: How many times in Dustin past 24 hours?      None   5. ABDOMEN PAIN: Are you having any abdomen pain? If Yes, ask: What does it feel like? (e.g., crampy, dull, intermittent, constant)      Minimal cramping   6. ABDOMEN PAIN SEVERITY: If present, ask: How bad is Dustin pain?  (e.g., Scale 1-10; mild, moderate, or severe)     Mild   7. ORAL INTAKE: If vomiting, Have you been able to drink liquids? How much liquids have you had in Dustin past 24 hours?     Taking in Gatorade with electrolytes   8. HYDRATION: Any signs of dehydration? (e.g., dry mouth [not just dry lips], too weak to stand, dizziness, new weight loss) When did you last urinate?     Dry mouth, foggy denies falls but states he is aware balance is a bit off and is being careful when standing   9. EXPOSURE: Have you traveled to a foreign country recently? Have you  been exposed to anyone with diarrhea? Could you have eaten any food that was spoiled?     Pt reports traveling for [place] Years to [place]; no reports of diarrhea from anyone he was with   10. ANTIBIOTIC USE: Are you taking antibiotics now or have you taken antibiotics in Dustin past 2 months?       None   11. OTHER SYMPTOMS: Do you have any other symptoms? (e.g., fever, blood in stool)       Low grade fever per pt  Protocols used: Diarrhea-A-AH

## 2024-11-06 NOTE — Telephone Encounter (Signed)
 Spoke to patient and he wanted to add: While sitting on the toilet 10/29/24 the patient leaned forward and fell.  He thinks he hit his head on the floor.  He had a headache, shoulder, and neck pain.  His shoulder has improved.

## 2024-11-07 ENCOUNTER — Telehealth

## 2024-11-07 DIAGNOSIS — R55 Syncope and collapse: Secondary | ICD-10-CM

## 2024-11-07 DIAGNOSIS — A084 Viral intestinal infection, unspecified: Secondary | ICD-10-CM | POA: Diagnosis not present

## 2024-11-07 NOTE — Progress Notes (Signed)
 " Virtual Visit Consent   Dustin Hodges, you are scheduled for a virtual visit with a [facility] provider today. Just as with appointments in the office, your consent must be obtained to participate. Your consent will be active for this visit and any virtual visit you may have with one of our providers in the next 365 days. If you have a MyChart account, a copy of this consent can be sent to you electronically.  As this is a virtual visit, video technology does not allow for your provider to perform a traditional examination. This may limit your provider's ability to fully assess your condition. If your provider identifies any concerns that need to be evaluated in person or the need to arrange testing (such as labs, EKG, etc.), we will make arrangements to do so. Although advances in technology are sophisticated, we cannot ensure that it will always work on either your end or our end. If the connection with a video visit is poor, the visit may have to be switched to a telephone visit. With either a video or telephone visit, we are not always able to ensure that we have a secure connection.  By engaging in this virtual visit, you consent to the provision of healthcare and authorize for your insurance to be billed (if applicable) for the services provided during this visit. Depending on your insurance coverage, you may receive a charge related to this service.  I need to obtain your verbal consent now. Are you willing to proceed with your visit today? Aerik Polan has provided verbal consent on 11/07/2024 for a virtual visit (video or telephone). Delon CHRISTELLA Dickinson, PA-C  Date: 11/07/2024 9:02 AM   Virtual Visit via Video Note   I, Delon CHRISTELLA Dickinson, connected with  Dustin Hodges  ([id number], Jan 29, 1951) on 11/07/2024 at  8:45 AM EST by a video-enabled telemedicine application and verified that I am speaking with the correct person using two  identifiers.  Location: Patient: Virtual Visit Location Patient: Home Provider: Virtual Visit Location Provider: Home Office   I discussed the limitations of evaluation and management by telemedicine and the availability of in person appointments. The patient expressed understanding and agreed to proceed.    History of Present Illness: Dustin Hodges is a 74 y.o. who identifies as a male who was assigned male at birth, and is being seen today for diarrhea and syncope (not associated with diarrhea).  HPI: Diarrhea  This is a new problem. The current episode started in the past 7 days. Episode frequency: was having 6-8 watery BM from 11/03/24 - 11/06/24; over the last 24 hours has lessened to no more than 2. The stool consistency is described as Watery. The patient states that diarrhea awakens (Did initially but now improved) him from sleep. Associated symptoms include headaches. Pertinent negatives include no abdominal pain, bloating, chills, fever, myalgias, sweats or vomiting. Associated symptoms comments: Lightheaded, weakness, fatigue. Nothing aggravates the symptoms. There are no known risk factors. He has tried anti-motility drug for the symptoms. The treatment provided moderate relief.    Did have an episode of syncope on 10/29/24. Had been on the toilet straining with a BM. When he went to stand up he passed out and feels he did hit is head on the wall or floor. He felt a little dizzy getting up and had a mild headache. He did not have a laceration or cut from the fall. Following days he had a mild headache, neck stiffness and shoulder  stiffness. Symptoms are improving.  Problems:  Patient Active Problem List   Diagnosis Date Noted   CKD (chronic kidney disease) stage 2, GFR 60-89 ml/min 11/14/2020   Hypertension    Hyperlipidemia    DDD (degenerative disc disease), lumbar     Allergies: Allergies[1] Medications: Current Medications[2]  Observations/Objective: Patient is  well-developed, well-nourished in no acute distress.  Resting comfortably at home.  Head is normocephalic, atraumatic.  No labored breathing.  Speech is clear and coherent with logical content.  Patient is alert and oriented at baseline.    Assessment and Plan: 1. Viral enteritis (Primary)  - Suspected Viral GI illness - Improving - Continue to push fluids - May continue Imodium as needed - Try Benjamine diet today - If tolerates, slowly increase diet back to normal over next couple of days - Seek in person evaluation for stool culture if continues or worsens   2. Vasovagal syncope  - Improving - Possible mild concussion and discussed expectant course - Discussed trying to avoid straining as this can be a trigger - Follow up as needed  Follow Up Instructions: I discussed the assessment and treatment plan with the patient. The patient was provided an opportunity to ask questions and all were answered. The patient agreed with the plan and demonstrated an understanding of the instructions.  A copy of instructions were sent to the patient via MyChart unless otherwise noted below.    The patient was advised to call back or seek an in-person evaluation if the symptoms worsen or if the condition fails to improve as anticipated.    Delon HERO Zalaya Astarita, PA-C     [1] No Known Allergies [2]  Current Outpatient Medications:    amLODipine  (NORVASC ) 10 MG tablet, TAKE 1 TABLET(10 MG) BY MOUTH DAILY, Disp: 90 tablet, Rfl: 1   Cholecalciferol (VITAMIN D ) 50 MCG (2000 UT) tablet, Take 2,000 Units by mouth daily., Disp: , Rfl:    hydrocortisone  cream 1 %, Apply 1 Application topically 2 (two) times daily. Apply to bilateral external ears two times daily for 1 week, Disp: 30 g, Rfl: 0   lisinopril -hydrochlorothiazide  (ZESTORETIC ) 20-12.5 MG tablet, TAKE 1 TABLET BY MOUTH TWICE DAILY, Disp: 180 tablet, Rfl: 1   pantoprazole  (PROTONIX ) 40 MG tablet, TAKE 1 TABLET(40 MG) BY MOUTH DAILY, Disp: 90  tablet, Rfl: 1   pravastatin  (PRAVACHOL ) 40 MG tablet, TAKE 1 TABLET(40 MG) BY MOUTH AT BEDTIME, Disp: 90 tablet, Rfl: 1  "

## 2024-11-07 NOTE — Patient Instructions (Signed)
 " Dorn Mayer Drones, thank you for joining Delon CHRISTELLA Dickinson, PA-C for today's virtual visit.  While this provider is not your primary care provider (PCP), if your PCP is located in our provider database this encounter information will be shared with them immediately following your visit.   A [facility] MyChart account gives you access to today's visit and all your visits, tests, and labs performed at [facility]  click here if you don't have a [facility] MyChart account or go to [email or web address]  Consent: (Patient) Dustin Hodges provided verbal consent for this virtual visit at the beginning of the encounter.  Current Medications:  Current Outpatient Medications:    amLODipine  (NORVASC ) 10 MG tablet, TAKE 1 TABLET(10 MG) BY MOUTH DAILY, Disp: 90 tablet, Rfl: 1   Cholecalciferol (VITAMIN D ) 50 MCG (2000 UT) tablet, Take 2,000 Units by mouth daily., Disp: , Rfl:    hydrocortisone  cream 1 %, Apply 1 Application topically 2 (two) times daily. Apply to bilateral external ears two times daily for 1 week, Disp: 30 g, Rfl: 0   lisinopril -hydrochlorothiazide  (ZESTORETIC ) 20-12.5 MG tablet, TAKE 1 TABLET BY MOUTH TWICE DAILY, Disp: 180 tablet, Rfl: 1   pantoprazole  (PROTONIX ) 40 MG tablet, TAKE 1 TABLET(40 MG) BY MOUTH DAILY, Disp: 90 tablet, Rfl: 1   pravastatin  (PRAVACHOL ) 40 MG tablet, TAKE 1 TABLET(40 MG) BY MOUTH AT BEDTIME, Disp: 90 tablet, Rfl: 1   Medications ordered in this encounter:  No orders of the defined types were placed in this encounter.    *If you need refills on other medications prior to your next appointment, please contact your pharmacy*  Follow-Up: Call back or seek an in-person evaluation if the symptoms worsen or if the condition fails to improve as anticipated.  [facility] [phone]  Other Instructions  Food Choices to Help Relieve Diarrhea, Adult Diarrhea can make you feel weak and cause you to become  dehydrated. Dehydration is a condition in which there is not enough water or other fluids in the body. It is important to choose the right foods and drinks to: Relieve diarrhea. Replace lost fluids and nutrients. Prevent dehydration. What are tips for following this plan? Relieving diarrhea Avoid foods that make your diarrhea worse. These may include: Foods and drinks that are sweetened with high-fructose corn syrup, honey, or sweeteners such as xylitol, sorbitol, and mannitol. Check food labels for these ingredients. Fried, greasy, or spicy foods. Raw fruits and vegetables. Eat foods that are rich in probiotics. These include foods such as yogurt and fermented milk products. Probiotics can help increase healthy bacteria in your stomach and intestines (gastrointestinal or GI tract). This may help digestion and stop diarrhea. If you have lactose intolerance, avoid dairy products. These may make your diarrhea worse. Take medicine to help stop diarrhea only as told by your health care provider. Replacing nutrients  Eat bland, easy-to-digest foods in small amounts as you are able, until your diarrhea starts to get better. These foods include bananas, applesauce, rice, toast, and crackers. Over time, add nutrient-rich foods as your body tolerates them or as told by your health care provider. These include: Well-cooked protein foods, such as eggs, lean meats like fish or chicken without skin, and tofu. Peeled, seeded, and soft-cooked fruits and vegetables. Low-fat dairy products. Whole grains. Take vitamin and mineral supplements as told by your health care provider. Preventing dehydration  Start by sipping water or a solution to prevent dehydration (oral rehydration solution, or ORS). This is  a drink that helps replace fluids and minerals your body has lost. You can buy an ORS at pharmacies and retail stores. Try to drink at least 8-10 cups (2,000-2,500 mL) of fluid each day to help replace lost  fluids. If your urine is pale yellow, you are getting enough fluids. You may drink other liquids in addition to water, such as fruit juice that you have added water to (diluted fruit juice) or low-calorie sports drinks, as tolerated or as told by your health care provider. Avoid drinks with caffeine, such as coffee, tea, or soft drinks. Avoid alcohol. This information is not intended to replace advice given to you by your health care provider. Make sure you discuss any questions you have with your health care provider. Document Revised: 04/06/2022 Document Reviewed: 04/06/2022 Elsevier Patient Education  2024 Elsevier Inc.   If you have been instructed to have an in-person evaluation today at a local Urgent Care facility, please use the link below. It will take you to a list of all of our available [facility], including address, phone number and hours of operation. Please do not delay care.  [facility]  If you or a family member do not have a primary care provider, use the link below to schedule a visit and establish care. When you choose a [facility] primary care physician or advanced practice provider, you gain a long-term partner in health. Find a Primary Care Provider  Learn more about [facility] in-office and virtual care options: [facility] - Get Care Now "
